# Patient Record
Sex: Female | Born: 1965 | Race: White | Hispanic: No | Smoking: Former smoker
Health system: Southern US, Community
[De-identification: ages and names within clinical notes are randomized; demographics above are authoritative.]

## PROBLEM LIST (undated history)

## (undated) DIAGNOSIS — F329 Major depressive disorder, single episode, unspecified: Secondary | ICD-10-CM

## (undated) DIAGNOSIS — F32A Depression, unspecified: Secondary | ICD-10-CM

## (undated) DIAGNOSIS — R232 Flushing: Secondary | ICD-10-CM

## (undated) DIAGNOSIS — R079 Chest pain, unspecified: Secondary | ICD-10-CM

## (undated) DIAGNOSIS — K219 Gastro-esophageal reflux disease without esophagitis: Secondary | ICD-10-CM

## (undated) DIAGNOSIS — F988 Other specified behavioral and emotional disorders with onset usually occurring in childhood and adolescence: Secondary | ICD-10-CM

## (undated) DIAGNOSIS — R030 Elevated blood-pressure reading, without diagnosis of hypertension: Secondary | ICD-10-CM

## (undated) DIAGNOSIS — E119 Type 2 diabetes mellitus without complications: Secondary | ICD-10-CM

## (undated) DIAGNOSIS — F419 Anxiety disorder, unspecified: Secondary | ICD-10-CM

## (undated) DIAGNOSIS — E559 Vitamin D deficiency, unspecified: Secondary | ICD-10-CM

## (undated) DIAGNOSIS — R51 Headache: Secondary | ICD-10-CM

## (undated) DIAGNOSIS — R519 Headache, unspecified: Secondary | ICD-10-CM

## (undated) DIAGNOSIS — T7840XA Allergy, unspecified, initial encounter: Secondary | ICD-10-CM

## (undated) HISTORY — DX: Vitamin D deficiency, unspecified: E55.9

## (undated) HISTORY — DX: Elevated blood-pressure reading, without diagnosis of hypertension: R03.0

## (undated) HISTORY — DX: Headache, unspecified: R51.9

## (undated) HISTORY — DX: Other specified behavioral and emotional disorders with onset usually occurring in childhood and adolescence: F98.8

## (undated) HISTORY — DX: Chest pain, unspecified: R07.9

## (undated) HISTORY — PX: ABDOMINAL HYSTERECTOMY: SHX81

## (undated) HISTORY — DX: Depression, unspecified: F32.A

## (undated) HISTORY — DX: Allergy, unspecified, initial encounter: T78.40XA

## (undated) HISTORY — DX: Gastro-esophageal reflux disease without esophagitis: K21.9

## (undated) HISTORY — DX: Headache: R51

## (undated) HISTORY — DX: Type 2 diabetes mellitus without complications: E11.9

## (undated) HISTORY — DX: Flushing: R23.2

## (undated) HISTORY — DX: Anxiety disorder, unspecified: F41.9

## (undated) HISTORY — DX: Major depressive disorder, single episode, unspecified: F32.9

---

## 1995-04-12 HISTORY — PX: OTHER SURGICAL HISTORY: SHX169

## 1995-04-12 HISTORY — PX: TRACHEOSTOMY: SUR1362

## 1998-03-10 ENCOUNTER — Emergency Department (HOSPITAL_COMMUNITY): Admission: EM | Admit: 1998-03-10 | Discharge: 1998-03-11 | Payer: Self-pay | Admitting: Emergency Medicine

## 2016-01-06 ENCOUNTER — Other Ambulatory Visit: Payer: Self-pay | Admitting: Family Medicine

## 2016-01-06 DIAGNOSIS — Z1231 Encounter for screening mammogram for malignant neoplasm of breast: Secondary | ICD-10-CM

## 2016-01-20 ENCOUNTER — Other Ambulatory Visit: Payer: Self-pay | Admitting: Family Medicine

## 2016-01-20 ENCOUNTER — Ambulatory Visit
Admission: RE | Admit: 2016-01-20 | Discharge: 2016-01-20 | Disposition: A | Discharge status: Home / Self Care | Payer: 59 | Source: Ambulatory Visit | Attending: Family Medicine | Admitting: Family Medicine

## 2016-01-20 DIAGNOSIS — Z1231 Encounter for screening mammogram for malignant neoplasm of breast: Secondary | ICD-10-CM

## 2016-02-17 ENCOUNTER — Telehealth: Payer: Self-pay | Admitting: *Deleted

## 2016-02-17 NOTE — Telephone Encounter (Signed)
Unable to reach patient at time of Pre-Visit Call.  Left message for patient to return call when available.    

## 2016-02-18 ENCOUNTER — Ambulatory Visit (HOSPITAL_BASED_OUTPATIENT_CLINIC_OR_DEPARTMENT_OTHER)
Admission: RE | Admit: 2016-02-18 | Discharge: 2016-02-18 | Disposition: A | Discharge status: Home / Self Care | Payer: 59 | Source: Ambulatory Visit | Attending: Family Medicine | Admitting: Family Medicine

## 2016-02-18 ENCOUNTER — Ambulatory Visit (INDEPENDENT_AMBULATORY_CARE_PROVIDER_SITE_OTHER): Payer: 59 | Admitting: Family Medicine

## 2016-02-18 ENCOUNTER — Encounter: Payer: Self-pay | Admitting: Family Medicine

## 2016-02-18 VITALS — BP 129/86 | HR 86 | Temp 98.1°F | Ht 62.0 in | Wt 211.4 lb

## 2016-02-18 DIAGNOSIS — K449 Diaphragmatic hernia without obstruction or gangrene: Secondary | ICD-10-CM | POA: Diagnosis not present

## 2016-02-18 DIAGNOSIS — R1011 Right upper quadrant pain: Secondary | ICD-10-CM | POA: Diagnosis not present

## 2016-02-18 DIAGNOSIS — S27809A Unspecified injury of diaphragm, initial encounter: Secondary | ICD-10-CM

## 2016-02-18 DIAGNOSIS — Z131 Encounter for screening for diabetes mellitus: Secondary | ICD-10-CM | POA: Diagnosis not present

## 2016-02-18 DIAGNOSIS — R938 Abnormal findings on diagnostic imaging of other specified body structures: Secondary | ICD-10-CM | POA: Insufficient documentation

## 2016-02-18 NOTE — Patient Instructions (Signed)
It was good to meet you today- please go to the lab and then to the imaging dept on the ground floor, I will be in touch with your labs and x-ray asap We will also set you up for an ultrasound of your gallbladder If you have any worsening of your symptoms- fever, vomiting, worsening pain- please seek care right away
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
## 2016-02-18 NOTE — Progress Notes (Signed)
Pre visit review using our clinic review tool, if applicable. No additional management support is needed unless otherwise documented below in the visit note. 

## 2016-02-19 ENCOUNTER — Ambulatory Visit (HOSPITAL_BASED_OUTPATIENT_CLINIC_OR_DEPARTMENT_OTHER)
Admission: RE | Admit: 2016-02-19 | Discharge: 2016-02-19 | Disposition: A | Discharge status: Home / Self Care | Payer: 59 | Source: Ambulatory Visit | Attending: Family Medicine | Admitting: Family Medicine

## 2016-02-19 DIAGNOSIS — R932 Abnormal findings on diagnostic imaging of liver and biliary tract: Secondary | ICD-10-CM | POA: Diagnosis not present

## 2016-02-19 DIAGNOSIS — R1011 Right upper quadrant pain: Secondary | ICD-10-CM | POA: Insufficient documentation

## 2016-02-19 LAB — COMPREHENSIVE METABOLIC PANEL
ALBUMIN: 4.5 g/dL (ref 3.5–5.2)
ALK PHOS: 65 U/L (ref 39–117)
ALT: 49 U/L — AB (ref 0–35)
AST: 31 U/L (ref 0–37)
BILIRUBIN TOTAL: 0.3 mg/dL (ref 0.2–1.2)
BUN: 12 mg/dL (ref 6–23)
CALCIUM: 9.8 mg/dL (ref 8.4–10.5)
CO2: 30 mEq/L (ref 19–32)
CREATININE: 0.8 mg/dL (ref 0.40–1.20)
Chloride: 103 mEq/L (ref 96–112)
GFR: 80.13 mL/min (ref >60.00)
Glucose, Bld: 81 mg/dL (ref 70–99)
Potassium: 4.6 mEq/L (ref 3.5–5.1)
SODIUM: 141 meq/L (ref 135–145)
TOTAL PROTEIN: 7.7 g/dL (ref 6.0–8.3)

## 2016-02-19 LAB — CBC
HCT: 42 % (ref 36.0–46.0)
Hemoglobin: 14 g/dL (ref 12.0–15.0)
MCHC: 33.3 g/dL (ref 30.0–36.0)
MCV: 89 fl (ref 78.0–100.0)
PLATELETS: 238 10*3/uL (ref 150.0–400.0)
RBC: 4.71 Mil/uL (ref 3.87–5.11)
RDW: 13.7 % (ref 11.5–15.5)
WBC: 8.8 10*3/uL (ref 4.0–10.5)

## 2016-02-19 LAB — HEMOGLOBIN A1C: HEMOGLOBIN A1C: 5.9 % (ref 4.6–6.5)

## 2016-02-20 ENCOUNTER — Telehealth: Payer: Self-pay | Admitting: Family Medicine

## 2016-02-20 DIAGNOSIS — Z1211 Encounter for screening for malignant neoplasm of colon: Secondary | ICD-10-CM

## 2016-02-20 NOTE — Telephone Encounter (Signed)
Called her- she needs a referral to GI for a colonoscopy.  Will also see if I can get the reading room to dig up he old films from the past for comparison

## 2016-02-22 ENCOUNTER — Encounter: Payer: Self-pay | Admitting: Gastroenterology

## 2016-02-23 ENCOUNTER — Encounter: Payer: Self-pay | Admitting: Family Medicine

## 2016-02-23 NOTE — Telephone Encounter (Signed)
Called reading room- they are going to try and find her old films.  They do not seem to be digitized

## 2016-02-26 ENCOUNTER — Encounter: Payer: Self-pay | Admitting: Family Medicine

## 2016-03-28 ENCOUNTER — Telehealth: Payer: Self-pay | Admitting: *Deleted

## 2016-03-28 NOTE — Telephone Encounter (Signed)
Phoned pt and explained Dr. Loletha Carrow recommends OV instead of PV due to pt complaints during CPE. Scheduled as requested for Wednesday. Colonoscopy appointment not cancelled at this time.

## 2016-03-28 NOTE — Telephone Encounter (Signed)
I reviewed the chart.  I agree, she is having chronic RUQ pain and Dr Arlyn Dunning intention was clearly to get a GI consult about that ( in addition to needing a screening colonoscopy).  I need to see her in the office, please.  Clinic time is tight, especially with the holidays and I am working the hospital next week.  I have time this Wednesday afternoon.  Please ask if she can come then.

## 2016-03-28 NOTE — Telephone Encounter (Signed)
Dr. Loletha Carrow,   It appears that this patient was seen for a CPE in 11/17 with Dr. Edilia Bo.  Has some complaints of abdominal pain, and was referred for a screening colonoscopy.  Could you please review the chart?  Does this patient need an office visit or is it okay to proceed with the colonoscopy?  Thanks, Vinnie Level

## 2016-03-30 ENCOUNTER — Encounter: Payer: Self-pay | Admitting: Gastroenterology

## 2016-03-30 ENCOUNTER — Ambulatory Visit (INDEPENDENT_AMBULATORY_CARE_PROVIDER_SITE_OTHER): Payer: 59 | Admitting: Gastroenterology

## 2016-03-30 VITALS — BP 128/90 | HR 76 | Ht 62.0 in | Wt 213.0 lb

## 2016-03-30 DIAGNOSIS — K219 Gastro-esophageal reflux disease without esophagitis: Secondary | ICD-10-CM

## 2016-03-30 DIAGNOSIS — Z1211 Encounter for screening for malignant neoplasm of colon: Secondary | ICD-10-CM | POA: Diagnosis not present

## 2016-03-30 DIAGNOSIS — R0789 Other chest pain: Secondary | ICD-10-CM | POA: Diagnosis not present

## 2016-03-30 MED ORDER — NA SULFATE-K SULFATE-MG SULF 17.5-3.13-1.6 GM/177ML PO SOLN: 1.0000 | Freq: Once | ORAL | 0 refills | Status: AC

## 2016-03-30 NOTE — Patient Instructions (Addendum)
For the next 4-6 weeks, stop ranitidine and instead take omeprazole 20 mg twice daily (30 minutes before breakfast and evening meal).  This medicine is available over the counter.  You have been scheduled for a colonoscopy. Please follow written instructions given to you at your visit today.  Please pick up your prep supplies at the pharmacy within the next 1-3 days. If you use inhalers (even only as needed), please bring them with you on the day of your procedure. Your physician has requested that you go to www.startemmi.com and enter the access code given to you at your visit today. This web site gives a general overview about your procedure. However, you should still follow specific instructions given to you by our office regarding your preparation for the procedure.   Food Choices for Gastroesophageal Reflux Disease, Adult When you have gastroesophageal reflux disease (GERD), the foods you eat and your eating habits are very important. Choosing the right foods can help ease your discomfort. What guidelines do I need to follow?  Choose fruits, vegetables, whole grains, and low-fat dairy products.  Choose low-fat meat, fish, and poultry.  Limit fats such as oils, salad dressings, butter, nuts, and avocado.  Keep a food diary. This helps you identify foods that cause symptoms.  Avoid foods that cause symptoms. These may be different for everyone.  Eat small meals often instead of 3 large meals a day.  Eat your meals slowly, in a place where you are relaxed.  Limit fried foods.  Cook foods using methods other than frying.  Avoid drinking alcohol.  Avoid drinking large amounts of liquids with your meals.  Avoid bending over or lying down until 2-3 hours after eating. What foods are not recommended? These are some foods and drinks that may make your symptoms worse: Vegetables  Tomatoes. Tomato juice. Tomato and spaghetti sauce. Chili peppers. Onion and garlic. Horseradish. Fruits   Oranges, grapefruit, and lemon (fruit and juice). Meats  High-fat meats, fish, and poultry. This includes hot dogs, ribs, ham, sausage, salami, and bacon. Dairy  Whole milk and chocolate milk. Sour cream. Cream. Butter. Ice cream. Cream cheese. Drinks  Coffee and tea. Bubbly (carbonated) drinks or energy drinks. Condiments  Hot sauce. Barbecue sauce. Sweets/Desserts  Chocolate and cocoa. Donuts. Peppermint and spearmint. Fats and Oils  High-fat foods. This includes [place] fries and potato chips. Other  Vinegar. Strong spices. This includes black pepper, white pepper, red pepper, cayenne, curry powder, cloves, ginger, and chili powder. The items listed above may not be a complete list of foods and drinks to avoid. Contact your dietitian for more information.  This information is not intended to replace advice given to you by your health care provider. Make sure you discuss any questions you have with your health care provider. Document Released: 09/27/2011 Document Revised: 09/03/2015 Document Reviewed: 01/30/2013 Elsevier Interactive Patient Education  2017 [employer].  Gastroesophageal Reflux Scan A gastroesophageal reflux scan is a procedure that is used to check for gastroesophageal reflux, which is the backward flow of stomach contents into the tube that carries food from the mouth to the stomach (esophagus). The scan can also show if any stomach contents are inhaled (aspirated) into your lungs. You may need this scan if you have symptoms such as heartburn, vomiting, swallowing problems, or regurgitation. Regurgitation means that swallowed food is returning from the stomach to the esophagus. For this scan, you will drink a liquid that contains a small amount of a radioactive substance (tracer). A scanner  with a camera that detects the radioactive tracer is used to see if any of the material backs up into your esophagus. Tell a health care provider about:  Any allergies you  have.  All medicines you are taking, including vitamins, herbs, eye drops, creams, and over-the-counter medicines.  Any blood disorders you have.  Any surgeries you have had.  Any medical conditions you have.  If you are pregnant or you think that you may be pregnant.  If you are breastfeeding. What are the risks? Generally, this is a safe procedure. However, problems may occur, including:  Exposure to radiation (a small amount).  Allergic reaction to the radioactive substance. This is rare. What happens before the procedure?  Ask your health care provider about changing or stopping your regular medicines. This is especially important if you are taking diabetes medicines or blood thinners.  Follow your health care provider's instructions about eating or drinking restrictions. What happens during the procedure?  You will be asked to drink a liquid that contains a small amount of a radioactive tracer. This liquid will probably be similar to orange juice.  You will assume a position lying on your back.  A series of images will be taken of your esophagus and upper stomach.  You may be asked to move into different positions to help determine if reflux occurs more often when you are in specific positions.  For adults, an abdominal binder with an inflatable cuff may be placed on the belly (abdomen). This may be used to increase abdominal pressure. More images will be taken to see if the increased pressure causes reflux to occur. The procedure may vary among health care providers and hospitals. What happens after the procedure?  Return to your normal activities and your normal diet as directed by your health care provider.  The radioactive tracer will leave your body over the next few days. Drink enough fluid to keep your urine clear or pale yellow. This will help to flush the tracer out of your body.  It is your responsibility to obtain your test results. Ask your health care  provider or the department performing the test when and how you will get your results. This information is not intended to replace advice given to you by your health care provider. Make sure you discuss any questions you have with your health care provider. Document Released: 05/19/2005 Document Revised: 12/21/2015 Document Reviewed: 01/07/2014 Elsevier Interactive Patient Education  2017 [employer].   If you are age 17 or older, your body mass index should be between 23-30. Your Body mass index is 38.96 kg/m. If this is out of the aforementioned range listed, please consider follow up with your Primary Care Provider.  If you are age 60 or younger, your body mass index should be between 19-25. Your Body mass index is 38.96 kg/m. If this is out of the aformentioned range listed, please consider follow up with your Primary Care Provider.   Thank you for choosing [facility]  Dr Wilfrid Lund III
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
## 2016-04-13 ENCOUNTER — Telehealth: Payer: Self-pay | Admitting: Gastroenterology

## 2016-04-14 NOTE — Telephone Encounter (Signed)
Not this time 

## 2016-04-15 ENCOUNTER — Encounter: Payer: 59 | Admitting: Gastroenterology

## 2016-06-10 ENCOUNTER — Encounter (HOSPITAL_COMMUNITY): Payer: Self-pay

## 2016-06-10 ENCOUNTER — Emergency Department (HOSPITAL_COMMUNITY)
Admission: EM | Admit: 2016-06-10 | Discharge: 2016-06-11 | Disposition: A | Discharge status: Home / Self Care | Payer: 59 | Attending: Emergency Medicine | Admitting: Emergency Medicine

## 2016-06-10 DIAGNOSIS — R1031 Right lower quadrant pain: Secondary | ICD-10-CM | POA: Diagnosis present

## 2016-06-10 DIAGNOSIS — R1900 Intra-abdominal and pelvic swelling, mass and lump, unspecified site: Secondary | ICD-10-CM | POA: Insufficient documentation

## 2016-06-10 DIAGNOSIS — K659 Peritonitis, unspecified: Secondary | ICD-10-CM | POA: Diagnosis not present

## 2016-06-10 DIAGNOSIS — K529 Noninfective gastroenteritis and colitis, unspecified: Secondary | ICD-10-CM

## 2016-06-10 DIAGNOSIS — K6389 Other specified diseases of intestine: Secondary | ICD-10-CM

## 2016-06-10 DIAGNOSIS — F17210 Nicotine dependence, cigarettes, uncomplicated: Secondary | ICD-10-CM | POA: Insufficient documentation

## 2016-06-10 LAB — COMPREHENSIVE METABOLIC PANEL
ALT: 30 U/L (ref 14–54)
AST: 25 U/L (ref 15–41)
Albumin: 4.1 g/dL (ref 3.5–5.0)
Alkaline Phosphatase: 58 U/L (ref 38–126)
Anion gap: 9 (ref 5–15)
BUN: 10 mg/dL (ref 6–20)
CHLORIDE: 102 mmol/L (ref 101–111)
CO2: 26 mmol/L (ref 22–32)
CREATININE: 0.84 mg/dL (ref 0.44–1.00)
Calcium: 9.1 mg/dL (ref 8.9–10.3)
GFR calc Af Amer: >60 mL/min (ref >60)
Glucose, Bld: 132 mg/dL — ABNORMAL HIGH (ref 65–99)
Potassium: 3.7 mmol/L (ref 3.5–5.1)
SODIUM: 137 mmol/L (ref 135–145)
Total Bilirubin: 0.6 mg/dL (ref 0.3–1.2)
Total Protein: 7.2 g/dL (ref 6.5–8.1)

## 2016-06-10 LAB — CBC
HCT: 41.9 % (ref 36.0–46.0)
Hemoglobin: 13.6 g/dL (ref 12.0–15.0)
MCH: 29.7 pg (ref 26.0–34.0)
MCHC: 32.5 g/dL (ref 30.0–36.0)
MCV: 91.5 fL (ref 78.0–100.0)
PLATELETS: 234 10*3/uL (ref 150–400)
RBC: 4.58 MIL/uL (ref 3.87–5.11)
RDW: 12.6 % (ref 11.5–15.5)
WBC: 9.6 10*3/uL (ref 4.0–10.5)

## 2016-06-10 LAB — URINALYSIS, ROUTINE W REFLEX MICROSCOPIC
Bilirubin Urine: NEGATIVE
Glucose, UA: NEGATIVE mg/dL
Hgb urine dipstick: NEGATIVE
KETONES UR: NEGATIVE mg/dL
Nitrite: NEGATIVE
PH: 7 (ref 5.0–8.0)
Protein, ur: NEGATIVE mg/dL
SPECIFIC GRAVITY, URINE: 1.019 (ref 1.005–1.030)

## 2016-06-10 LAB — LIPASE, BLOOD: LIPASE: 32 U/L (ref 11–51)

## 2016-06-10 MED ORDER — IBUPROFEN 400 MG PO TABS
ORAL_TABLET | ORAL | Status: DC
Start: 2016-06-10 — End: 2016-06-11
  Filled 2016-06-10: qty 1

## 2016-06-10 MED ORDER — IBUPROFEN 400 MG PO TABS
400.0000 mg | ORAL_TABLET | Freq: Once | ORAL | Status: AC | PRN
  Administered 2016-06-10: 400 mg via ORAL

## 2016-06-10 NOTE — ED Provider Notes (Signed)
[facility] DEPT Provider Note   CSN: [id number] Arrival date & time: 06/10/16  1802     History   Chief Complaint Chief Complaint  Patient presents with  . Abdominal Pain    HPI NAOKO COPPA is a 51 y.o. female with a past medical history of previous abdominal surgeries. He presents emergency Department with chief complaint of right lower quadrant. Pain. Patient states the onset of symptoms began yesterday p.m. She complains of right lower quadrant pain that is constant but at times sharp and severe. She rates the pain between 5 and 7 out of 10. She denies any urinary symptoms, vaginal symptoms, and history of kidney stones, nausea, vomiting, diarrhea. She denies fevers or chills. Patient is postmenopausal  HPI  Past Medical History:  Diagnosis Date  . Depression   . Elevated blood pressure reading   . Frequent headaches     There are no active problems to display for this patient.   Past Surgical History:  Procedure Laterality Date  . Stomach ulcer    . TRACHEOSTOMY      OB History    No data available       Home Medications    Prior to Admission medications   Medication Sig Start Date End Date Taking? Authorizing Provider  ranitidine (ZANTAC) 150 MG capsule Take 150 mg by mouth 2 (two) times daily.    Historical Provider, MD    Family History Family History  Problem Relation Age of Onset  . Diabetes Sister   . Ovarian cancer Sister   . Emphysema Paternal Grandmother     Social History Social History  Substance Use Topics  . Smoking status: Current Every Day Smoker    Types: E-cigarettes  . Smokeless tobacco: Former Systems developer    Quit date: 04/12/2011     Comment: Quit smoking Cigarettes around 2013. Currently using vapes  . Alcohol use Yes     Comment: occ      Allergies   Penicillins   Review of Systems Review of Systems Ten systems reviewed and are negative for acute change, except as noted in the HPI.    Physical Exam Updated Vital  Signs BP 134/79 (BP Location: Right Arm)   Pulse 73   Temp 98.6 F (37 C) (Oral)   Resp 18   Ht 5' 1.5" (1.562 m)   SpO2 95%   Physical Exam  Physical Exam  Nursing note and vitals reviewed. Constitutional: She is oriented to person, place, and time. She appears well-developed and well-nourished. No distress.  HENT:  Head: Normocephalic and atraumatic.  Eyes: Conjunctivae normal and EOM are normal. Pupils are equal, round, and reactive to light. No scleral icterus.  Neck: Normal range of motion.  Cardiovascular: Normal rate, regular rhythm and normal heart sounds.  Exam reveals no gallop and no friction rub.   No murmur heard. Pulmonary/Chest: Effort normal and breath sounds normal. No respiratory distress.  Abdominal: Soft. Bowel sounds are normal. She exhibits no distention. Mild diffuse tenderness, worse in the right lower quadrant with negative obturator and Rovsing sign distension Neurological: She is alert and oriented to person, place, and time.  Skin: Skin is warm and dry. She is not diaphoretic.    ED Treatments / Results  Labs (all labs ordered are listed, but only abnormal results are displayed) Labs Reviewed  COMPREHENSIVE METABOLIC PANEL - Abnormal; Notable for the following:       Result Value   Glucose, Bld 132 (*)    All  other components within normal limits  URINALYSIS, ROUTINE W REFLEX MICROSCOPIC - Abnormal; Notable for the following:    APPearance HAZY (*)    Leukocytes, UA MODERATE (*)    Bacteria, UA MANY (*)    Squamous Epithelial / LPF 6-30 (*)    All other components within normal limits  LIPASE, BLOOD  CBC    EKG  EKG Interpretation None       Radiology No results found.  Procedures Procedures (including critical care time)  Medications Ordered in ED Medications  ibuprofen (ADVIL,MOTRIN) 400 MG tablet (not administered)  ibuprofen (ADVIL,MOTRIN) tablet 400 mg (400 mg Oral Given 06/10/16 1832)     Initial Impression / Assessment  and Plan / ED Course  I have reviewed the triage vital signs and the nursing notes.  Pertinent labs & imaging results that were available during my care of the patient were reviewed by me and considered in my medical decision making (see chart for details).     Patient with right-sided epiploic appendage iritis. There is also an incidental pelvic mass noted on the CT scan. This is on the left side with the patient does not have pain. I believe the patient's urine appears contaminated. Patient will be discharged with pain control. She is advised to follow-up with her primary care physician regarding her pelvic mass for further evaluation. I discussed return precautions with the patient. She appears safe for discharge at this time  Final Clinical Impressions(s) / ED Diagnoses   Final diagnoses:  Epiploic appendagitis  Pelvic mass in female    New Prescriptions New Prescriptions   No medications on file     Margarita Mail, PA-C 06/11/16 [place], MD 06/12/16 2328

## 2016-06-10 NOTE — ED Triage Notes (Signed)
Per Pt, Pt is coming from home with complaints of lower right abdominal pain that starts yesterday. Denies any N/V/D. Denies urinary symptoms or vaginal discharge. Reports going through menopause.

## 2016-06-11 ENCOUNTER — Encounter (HOSPITAL_COMMUNITY): Payer: Self-pay | Admitting: Radiology

## 2016-06-11 ENCOUNTER — Emergency Department (HOSPITAL_COMMUNITY): Payer: 59

## 2016-06-11 MED ORDER — IOPAMIDOL (ISOVUE-300) INJECTION 61%
INTRAVENOUS | Status: AC
Start: 2016-06-11 — End: 2016-06-11
  Administered 2016-06-11: 100 mL
  Filled 2016-06-11: qty 100

## 2016-06-11 MED ORDER — MELOXICAM 15 MG PO TABS: 15.0000 mg | ORAL_TABLET | Freq: Every day | ORAL | 0 refills | Status: DC

## 2016-06-11 MED ORDER — OXYCODONE-ACETAMINOPHEN 5-325 MG PO TABS: 1.0000 | ORAL_TABLET | ORAL | 0 refills | Status: DC | PRN

## 2016-06-11 NOTE — ED Notes (Signed)
Patient transported to CT 

## 2016-06-11 NOTE — Discharge Instructions (Signed)
Epiploic appendagitis is a condition when one for the fat containing finger-like protrusions that hang from the colon twists on itself and begins to die . It is uncommon but benign and self limited. It frequently mimics appendicitis.  Your CT scan also showed a pelvic mass. You will need to follow up with your primary care doctor for further evaluation and you will need a pelvic ultrasound to characterize the mass.  SEEK IMMEDIATE MEDICAL ATTENTION IF: The pain does not go away or becomes severe.  A temperature above 101 develops.  Repeated vomiting occurs (multiple episodes).  The pain becomes localized to portions of the abdomen. The right side could possibly be appendicitis. In an adult, the left lower portion of the abdomen could be colitis or diverticulitis.  Blood is being passed in stools or vomit (bright red or black tarry stools).  Return also if you develop chest pain, difficulty breathing, dizziness or fainting, or become confused, poorly responsive, or inconsolable (young children).

## 2016-06-13 ENCOUNTER — Ambulatory Visit: Payer: Self-pay | Admitting: Family Medicine

## 2016-06-16 ENCOUNTER — Ambulatory Visit (HOSPITAL_BASED_OUTPATIENT_CLINIC_OR_DEPARTMENT_OTHER)
Admission: RE | Admit: 2016-06-16 | Discharge: 2016-06-16 | Disposition: A | Discharge status: Home / Self Care | Payer: 59 | Source: Ambulatory Visit | Attending: Family Medicine | Admitting: Family Medicine

## 2016-06-16 ENCOUNTER — Ambulatory Visit (INDEPENDENT_AMBULATORY_CARE_PROVIDER_SITE_OTHER): Payer: 59 | Admitting: Family Medicine

## 2016-06-16 VITALS — BP 122/86 | HR 82 | Temp 97.9°F | Ht 62.0 in | Wt 212.4 lb

## 2016-06-16 DIAGNOSIS — R1900 Intra-abdominal and pelvic swelling, mass and lump, unspecified site: Secondary | ICD-10-CM | POA: Insufficient documentation

## 2016-06-16 MED ORDER — OXYCODONE-ACETAMINOPHEN 5-325 MG PO TABS: 1.0000 | ORAL_TABLET | Freq: Four times a day (QID) | ORAL | 0 refills | Status: DC | PRN

## 2016-06-16 NOTE — Patient Instructions (Signed)
Go to the imaging dept on the ground floor right away- they will get your ultrasound. We should get your results today!

## 2016-06-16 NOTE — Progress Notes (Signed)
[place] at [facility] [address] [place], [place] [address] [phone]  Date:  06/16/2016   Name:  Betty ALBERTS   DOB:  1965/06/25   MRN:  [id number]  PCP:  Lamar Blinks, MD    Chief Complaint: Follow-up (Pt here for CT scan f/u. Still having some lower right abd pain. )   History of Present Illness:  Betty Snyder is a 51 y.o. very pleasant female patient who presents with the following:  Seen in ED on 06-10-16.  HPI from this visit:  Betty Snyder is a 51 y.o. female with a past medical history of previous abdominal surgeries. He presents emergency Department with chief complaint of right lower quadrant. Pain. Patient states the onset of symptoms began yesterday p.m. She complains of right lower quadrant pain that is constant but at times sharp and severe. She rates the pain between 5 and 7 out of 10. She denies any urinary symptoms, vaginal symptoms, and history of kidney stones, nausea, vomiting, diarrhea. She denies fevers or chills. Patient is postmenopausal  Plan from ED visit on 06-10-16:  Patient with right-sided epiploic appendage iritis. There is also an incidental pelvic mass noted on the CT scan. This is on the left side with the patient does not have pain. I believe the patient's urine appears contaminated. Patient will be discharged with pain control. She is advised to follow-up with her primary care physician regarding her pelvic mass for further evaluation. I discussed return precautions with the patient. She appears safe for discharge at this time   HPI for today's visit:  Here today to follow-up on left sided pelvic mass that was discovered when the patient had a CT scan in the ED on 06-10-16.  Her other symptoms are improved but not totally resolved as of yet She has been very worried about her CT results and is afraid that she may have ovarian cancer.  She is eager to get her follow-up imaging asap    Related impression from CT:   1. Inflamed fat lobule on the right pericolic gutter, may be fat necrosis versus epiploic appendagitis. This is likely cause of patient's right-sided abdominal pain. The appendix is normal. 2. Lobular pelvic soft tissue mass. Differential considerations include exophytic uterine fibroid versus right ovarian mass. No prior exams for comparison. Recommend pelvic ultrasound for characterization. 3. Left lumbar hernia contains nonobstructed noninflamed descending colon. 4. Hepatic steatosis. 5. Abdominal aortic atherosclerosis.  Pt feels like she is overall getting better, her pain is improved She is able to eat again, and is having normal bowel/ bladder function She is still using the oxycodone on occasion but has a few pills left.  She would like to have a refill to use if needed She was given 10 oxycodone by the ER No fever or chills  There are no active problems to display for this patient.   Past Medical History:  Diagnosis Date  . Depression   . Elevated blood pressure reading   . Frequent headaches     Past Surgical History:  Procedure Laterality Date  . Stomach ulcer    . TRACHEOSTOMY      Social History  Substance Use Topics  . Smoking status: Current Every Day Smoker    Types: E-cigarettes  . Smokeless tobacco: Former Systems developer    Quit date: 04/12/2011     Comment: Quit smoking Cigarettes around 2013. Currently using vapes  . Alcohol use Yes  Comment: occ     Family History  Problem Relation Age of Onset  . Diabetes Sister   . Ovarian cancer Sister   . Emphysema Paternal Grandmother     Allergies  Allergen Reactions  . Penicillins Other (See Comments)    Pt states that she "blacks out".    Medication list has been reviewed and updated.  Current Outpatient Prescriptions on File Prior to Visit  Medication Sig Dispense Refill  . meloxicam (MOBIC) 15 MG tablet Take 1 tablet (15 mg total) by mouth daily. Take 1 daily with food. 10  tablet 0  . ranitidine (ZANTAC) 150 MG tablet Take 150 mg by mouth 2 (two) times daily as needed for heartburn.     No current facility-administered medications on file prior to visit.     Review of Systems:  As per HPI- otherwise negative.   Physical Examination: Vitals:   06/16/16 1603  BP: 122/86  Pulse: 82  Temp: 97.9 F (36.6 C)   Vitals:   06/16/16 1603  Weight: 212 lb 6.4 oz (96.3 kg)  Height: 5\' 2"  (1.575 m)   Body mass index is 38.85 kg/m. Ideal Body Weight: Weight in (lb) to have BMI = 25: 136.4  GEN: WDWN, NAD, Non-toxic, A & O x 3, obese, ow looks well HEENT: Atraumatic, Normocephalic. Neck supple. No masses, No LAD. Ears and Nose: No external deformity. CV: RRR, No M/G/R. No JVD. No thrill. No extra heart sounds. PULM: CTA B, no wheezes, crackles, rhonchi. No retractions. No resp. distress. No accessory muscle use. ABD: S,  ND, +BS. No rebound. No HSM.  Minimal RLQ tenderness to palpation No masses EXTR: No c/c/e NEURO Normal gait.  PSYCH: Normally interactive. Conversant. Not depressed or anxious appearing.  Calm demeanor.   Plan to get her [name] today Assessment and Plan:  Pelvic mass in female - Plan: US Pelvis Complete, US Transvaginal Non-OB  Following up today from recent ER visit when she had a CT abd pelvis to r/o appendicitis.  Appendix was normal but she was noted to have a right sided pelvic mass of some sort- ? Ovarian. She is here today to follow-up on this issue  Reviewed NCCSR- the 10 percocet she got from the ER are listed, nothing else  Meds ordered this encounter  Medications  . oxyCODONE-acetaminophen (PERCOCET) 5-325 MG tablet    Sig: Take 1-2 tablets by mouth every 6 (six) hours as needed.    Dispense:  10 tablet    Refill:  0      Signed Lamar Blinks, MD  US Transvaginal Non-ob  Result Date: 06/16/2016 CLINICAL DATA:  Pelvic mass. EXAM: TRANSABDOMINAL AND TRANSVAGINAL ULTRASOUND OF PELVIS TECHNIQUE: Both transabdominal  and transvaginal ultrasound examinations of the pelvis were performed. Transabdominal technique was performed for global imaging of the pelvis including uterus, ovaries, adnexal regions, and pelvic cul-de-sac. It was necessary to proceed with endovaginal exam following the transabdominal exam to visualize the uterus and ovaries. COMPARISON:  06/11/2016 CT abdomen and pelvis FINDINGS: Uterus Measurements: 8.4 x 4.1 x 4.0 cm. Pelvic mass measuring 6.5 x 6.3 x 4.4 cm appears contiguous with the fundus of the uterus compatible with exophytic subserosal myoma. The mass extends anterior and to the right of the uterus. Endometrium Thickness: 10.6 mm.  No focal abnormality visualized. Right ovary Measurements: 1.4 x 2.3 x 1.6 cm. Normal in appearance and distinct from the pelvic mass. Left ovary Not visualized. Other findings No abnormal free fluid. IMPRESSION: Pelvic mass on CT  appears contiguous with the fundus of the uterus compatible with exophytic subserosal myoma. The right ovary is identified distinct from the mass. Left ovary not visualized. Electronically Signed   By: Kristine Garbe M.D.   On: 06/16/2016 18:35   US Pelvis Complete  Result Date: 06/16/2016 CLINICAL DATA:  Pelvic mass. EXAM: TRANSABDOMINAL AND TRANSVAGINAL ULTRASOUND OF PELVIS TECHNIQUE: Both transabdominal and transvaginal ultrasound examinations of the pelvis were performed. Transabdominal technique was performed for global imaging of the pelvis including uterus, ovaries, adnexal regions, and pelvic cul-de-sac. It was necessary to proceed with endovaginal exam following the transabdominal exam to visualize the uterus and ovaries. COMPARISON:  06/11/2016 CT abdomen and pelvis FINDINGS: Uterus Measurements: 8.4 x 4.1 x 4.0 cm. Pelvic mass measuring 6.5 x 6.3 x 4.4 cm appears contiguous with the fundus of the uterus compatible with exophytic subserosal myoma. The mass extends anterior and to the right of the uterus. Endometrium Thickness:  10.6 mm.  No focal abnormality visualized. Right ovary Measurements: 1.4 x 2.3 x 1.6 cm. Normal in appearance and distinct from the pelvic mass. Left ovary Not visualized. Other findings No abnormal free fluid. IMPRESSION: Pelvic mass on CT appears contiguous with the fundus of the uterus compatible with exophytic subserosal myoma. The right ovary is identified distinct from the mass. Left ovary not visualized. Electronically Signed   By: Kristine Garbe M.D.   On: 06/16/2016 18:35   Ct Abdomen Pelvis W Contrast  Result Date: 06/11/2016 CLINICAL DATA:  Right lower quadrant pain. EXAM: CT ABDOMEN AND PELVIS WITH CONTRAST TECHNIQUE: Multidetector CT imaging of the abdomen and pelvis was performed using the standard protocol following bolus administration of intravenous contrast. CONTRAST:  141mL ISOVUE-300 IOPAMIDOL (ISOVUE-300) INJECTION 61% COMPARISON:  Right upper quadrant ultrasound 02/19/2016 FINDINGS: Lower chest: Elevation of the left hemidiaphragm with left basilar scarring. Emphysematous change at the lung bases. Trachea atelectasis in the right middle lobe. Hepatobiliary: Decreased hepatic density consistent with steatosis. No evidence of focal hepatic lesion. Gallbladder physiologically distended, no calcified stone. No biliary dilatation. Pancreas: No ductal dilatation or inflammation. Spleen: Normal in size without focal abnormality. Adrenals/Urinary Tract: No adrenal nodule. Symmetric renal enhancement and excretion on delayed phase imaging. Cortical scarring in the medial right and lateral left kidney. No hydronephrosis or perinephric edema. Urinary bladder is minimally distended. Stomach/Bowel: The appendix is normal. There is a fat lobule about the right pericolic gutter that may be an inflamed epiploic appendage or fat necrosis. No associated colonic inflammation. Left lumbar no small bowel dilatation or inflammation. Stomach is physiologically distended. Hernia contains normal appearing  descending colon, no obstruction or wall thickening. Vascular/Lymphatic: Mild aortic atherosclerosis without aneurysm. No adenopathy. Reproductive: Well-defined lobulated pelvic mass is just to the right of midline measures 6.6 x 6.6 x 7.0 cm and is may be contiguous with the uterine fundus. The left ovary is discretely visualized and is normal. The right ovary is not confidently identified. Other: No free air, free fluid, or intra-abdominal fluid collection. Small fat containing supraumbilical ventral abdominal wall hernia. Musculoskeletal: There are no acute or suspicious osseous abnormalities. IMPRESSION: 1. Inflamed fat lobule on the right pericolic gutter, may be fat necrosis versus epiploic appendagitis. This is likely cause of patient's right-sided abdominal pain. The appendix is normal. 2. Lobular pelvic soft tissue mass. Differential considerations include exophytic uterine fibroid versus right ovarian mass. No prior exams for comparison. Recommend pelvic ultrasound for characterization. 3. Left lumbar hernia contains nonobstructed noninflamed descending colon. 4. Hepatic steatosis. 5. Abdominal aortic atherosclerosis.  Electronically Signed   By: Jeb Levering M.D.   On: 06/11/2016 01:19   Called her- we are happy to report that the mass noted on CT is c/w a uterine fibroid.  She is quite relieved and will plan to see me son for a pap smear

## 2017-07-11 DIAGNOSIS — M79642 Pain in left hand: Secondary | ICD-10-CM | POA: Insufficient documentation

## 2017-07-11 DIAGNOSIS — M79641 Pain in right hand: Secondary | ICD-10-CM | POA: Insufficient documentation

## 2017-07-13 DIAGNOSIS — G5603 Carpal tunnel syndrome, bilateral upper limbs: Secondary | ICD-10-CM | POA: Insufficient documentation

## 2017-08-04 DIAGNOSIS — M7700 Medial epicondylitis, unspecified elbow: Secondary | ICD-10-CM | POA: Insufficient documentation

## 2017-10-11 ENCOUNTER — Telehealth: Payer: Self-pay

## 2017-10-11 NOTE — Telephone Encounter (Signed)
Yes

## 2017-10-11 NOTE — Telephone Encounter (Signed)
Copied from [place] [id number]. Topic: Inquiry >> Oct 11, 2017 11:27 AM Valla Leaver wrote: Reason for CRM: Patient would like to switch PCP from Copland at [facility] to one of the NP's at [facility]. Please call patient to notify if approved.

## 2017-10-11 NOTE — Telephone Encounter (Signed)
Please advise 

## 2017-10-11 NOTE — Telephone Encounter (Signed)
Ok with me 

## 2017-10-11 NOTE — Telephone Encounter (Signed)
Appointment 8/7 pt aware

## 2017-10-11 NOTE — Telephone Encounter (Signed)
Ok to schedule transfer appointment ?

## 2017-11-15 ENCOUNTER — Ambulatory Visit: Payer: 59 | Admitting: Family Medicine

## 2017-11-15 ENCOUNTER — Other Ambulatory Visit: Payer: Self-pay | Admitting: Family Medicine

## 2017-11-15 ENCOUNTER — Encounter: Payer: Self-pay | Admitting: Family Medicine

## 2017-11-15 VITALS — BP 106/70 | HR 67 | Temp 98.5°F | Ht 62.0 in | Wt 195.0 lb

## 2017-11-15 DIAGNOSIS — R739 Hyperglycemia, unspecified: Secondary | ICD-10-CM | POA: Diagnosis not present

## 2017-11-15 DIAGNOSIS — Z1321 Encounter for screening for nutritional disorder: Secondary | ICD-10-CM | POA: Diagnosis not present

## 2017-11-15 DIAGNOSIS — E669 Obesity, unspecified: Secondary | ICD-10-CM | POA: Diagnosis not present

## 2017-11-15 DIAGNOSIS — Z1231 Encounter for screening mammogram for malignant neoplasm of breast: Secondary | ICD-10-CM

## 2017-11-15 DIAGNOSIS — K068 Other specified disorders of gingiva and edentulous alveolar ridge: Secondary | ICD-10-CM | POA: Diagnosis not present

## 2017-11-15 DIAGNOSIS — Z7689 Persons encountering health services in other specified circumstances: Secondary | ICD-10-CM

## 2017-11-15 LAB — COMPREHENSIVE METABOLIC PANEL
ALT: 18 U/L (ref 0–35)
AST: 16 U/L (ref 0–37)
Albumin: 4.4 g/dL (ref 3.5–5.2)
Alkaline Phosphatase: 59 U/L (ref 39–117)
BILIRUBIN TOTAL: 0.4 mg/dL (ref 0.2–1.2)
BUN: 13 mg/dL (ref 6–23)
CO2: 31 mEq/L (ref 19–32)
CREATININE: 0.82 mg/dL (ref 0.40–1.20)
Calcium: 10 mg/dL (ref 8.4–10.5)
Chloride: 104 mEq/L (ref 96–112)
GFR: 77.36 mL/min (ref >60.00)
GLUCOSE: 96 mg/dL (ref 70–99)
Potassium: 4.1 mEq/L (ref 3.5–5.1)
SODIUM: 140 meq/L (ref 135–145)
Total Protein: 7.3 g/dL (ref 6.0–8.3)

## 2017-11-15 LAB — CBC WITH DIFFERENTIAL/PLATELET
BASOS ABS: 0 10*3/uL (ref 0.0–0.1)
Basophils Relative: 0.5 % (ref 0.0–3.0)
EOS ABS: 0.1 10*3/uL (ref 0.0–0.7)
Eosinophils Relative: 1.4 % (ref 0.0–5.0)
HEMATOCRIT: 43 % (ref 36.0–46.0)
Hemoglobin: 14.3 g/dL (ref 12.0–15.0)
LYMPHS ABS: 2.1 10*3/uL (ref 0.7–4.0)
LYMPHS PCT: 33.5 % (ref 12.0–46.0)
MCHC: 33.3 g/dL (ref 30.0–36.0)
MCV: 88.8 fl (ref 78.0–100.0)
Monocytes Absolute: 0.4 10*3/uL (ref 0.1–1.0)
Monocytes Relative: 5.8 % (ref 3.0–12.0)
NEUTROS ABS: 3.6 10*3/uL (ref 1.4–7.7)
NEUTROS PCT: 58.8 % (ref 43.0–77.0)
Platelets: 222 10*3/uL (ref 150.0–400.0)
RBC: 4.84 Mil/uL (ref 3.87–5.11)
RDW: 13.5 % (ref 11.5–15.5)
WBC: 6.2 10*3/uL (ref 4.0–10.5)

## 2017-11-15 LAB — HEMOGLOBIN A1C: Hgb A1c MFr Bld: 5.9 % (ref 4.6–6.5)

## 2017-11-15 LAB — VITAMIN D 25 HYDROXY (VIT D DEFICIENCY, FRACTURES): VITD: 18.94 ng/mL — AB (ref 30.00–100.00)

## 2017-11-15 LAB — TSH: TSH: 2.05 u[IU]/mL (ref 0.35–4.50)

## 2017-11-15 LAB — LIPID PANEL
CHOL/HDL RATIO: 5
Cholesterol: 187 mg/dL (ref 0–200)
HDL: 35.7 mg/dL — ABNORMAL LOW (ref >39.00)
LDL CALC: 115 mg/dL — AB (ref 0–99)
NonHDL: 150.96
Triglycerides: 181 mg/dL — ABNORMAL HIGH (ref 0.0–149.0)
VLDL: 36.2 mg/dL (ref 0.0–40.0)

## 2017-11-15 NOTE — Progress Notes (Signed)
   Subjective:    Patient ID: Betty Snyder, female    DOB: 1965-06-02, 52 y.o.   MRN: [id number]  HPI This is a 52 yo female who presents today to establish care. Previously saw Dr. Janett Billow Copland. Works as an Research scientist (physical sciences).   Recently seen at dentist and had excessive bleeding and was suggested she have an A1C(?).   Carpel Tunnel- sees Dr. Amedeo Plenty  Last CPE-  Mammo- 10/17 Pap- overdue Colonoscopy- never Tdap- unknown Eye- not regular Dental- regular Exercise- gym- treadmill Diet- has been working on reducing carbs  Past Medical History:  Diagnosis Date  . Depression   . Elevated blood pressure reading   . Frequent headaches    Past Surgical History:  Procedure Laterality Date  . Stomach ulcer    . TRACHEOSTOMY     Family History  Problem Relation Age of Onset  . Diabetes Sister   . Ovarian cancer Sister   . Emphysema Paternal Grandmother    Social History   Tobacco Use  . Smoking status: Current Every Day Smoker    Types: E-cigarettes  . Smokeless tobacco: Former Systems developer    Quit date: 04/12/2011  . Tobacco comment: Quit smoking Cigarettes around 2013. Currently using vapes  Substance Use Topics  . Alcohol use: Yes    Comment: occ   . Drug use: No      Review of Systems Per HPI    Objective:   Physical Exam Physical Exam  Vitals reviewed. Constitutional: Oriented to person, place, and time. Appears well-developed and well-nourished.  HENT:  Head: Normocephalic and atraumatic.  Eyes: Conjunctivae are normal.  Neck: Normal range of motion. Neck supple.  Cardiovascular: Normal rate.   Pulmonary/Chest: Effort normal.  Musculoskeletal: Normal gait.  Neurological: Alert and oriented to person, place, and time.  Skin: Skin is warm and dry.  Psychiatric: Normal mood and affect. Behavior is normal. Judgment and thought content normal.      BP 106/70 (BP Location: Right Arm, Patient Position: Sitting, Cuff Size: Large)   Pulse 67   Temp 98.5 F (36.9  C) (Oral)   Ht 5\' 2"  (1.575 m)   Wt 195 lb (88.5 kg)   SpO2 97%   BMI 35.67 kg/m  Wt Readings from Last 3 Encounters:  11/15/17 195 lb (88.5 kg)  06/16/16 212 lb 6.4 oz (96.3 kg)  03/30/16 213 lb (96.6 kg)       Assessment & Plan:  1. Encounter to establish care - reviewed previous PCP records/labs - follow up in 3-4 months for CPE/pap, update overdue health maintenance - provided information to schedule mammogram - encouraged her to have annual eye exam  2. Bleeding gums - CBC with Differential - Comprehensive metabolic panel  3. Encounter for vitamin deficiency screening - Vitamin D, 25-hydroxy  4. Obesity (BMI 35.0-39.9 without comorbidity) - Hemoglobin A1c - Lipid Panel - TSH  5. Elevated blood sugar - Comprehensive metabolic panel - Hemoglobin A1c - Lipid Panel - TSH   Clarene Reamer, FNP-BC  [facility] at [facility], [place]  11/15/2017 10:47 AM

## 2017-11-15 NOTE — Patient Instructions (Addendum)
Try thumb spica splint for your carpel tunnel  Over the counter anti-inflammatory medication- ibuprofen 200 mg- can take 2-3 tablets every 8-12 hours as needed, Alleve 1-2 tablets every 12 hours- as needed. Can also use ice/heat.   Supplements for inflammation Turmeric  Glucosamine Tart Cherry  Please call and schedule an appointment for screening mammogram. A referral is not needed.  [place]  Please schedule your complete physical for 3-4 months

## 2017-11-20 MED ORDER — VITAMIN D (ERGOCALCIFEROL) 1.25 MG (50000 UNIT) PO CAPS: 50000.0000 [IU] | ORAL_CAPSULE | ORAL | 1 refills | Status: DC

## 2017-11-20 NOTE — Addendum Note (Signed)
Addended by: Clarene Reamer B on: 11/20/2017 07:57 AM   Modules accepted: Orders

## 2018-01-16 ENCOUNTER — Ambulatory Visit
Admission: RE | Admit: 2018-01-16 | Discharge: 2018-01-16 | Disposition: A | Discharge status: Home / Self Care | Payer: 59 | Source: Ambulatory Visit | Attending: Family Medicine | Admitting: Family Medicine

## 2018-01-16 DIAGNOSIS — Z1231 Encounter for screening mammogram for malignant neoplasm of breast: Secondary | ICD-10-CM

## 2018-02-12 ENCOUNTER — Other Ambulatory Visit (HOSPITAL_COMMUNITY)
Admission: RE | Admit: 2018-02-12 | Discharge: 2018-02-12 | Disposition: A | Discharge status: Home / Self Care | Payer: 59 | Source: Ambulatory Visit | Attending: Family Medicine | Admitting: Family Medicine

## 2018-02-12 ENCOUNTER — Ambulatory Visit (INDEPENDENT_AMBULATORY_CARE_PROVIDER_SITE_OTHER): Payer: 59 | Admitting: Family Medicine

## 2018-02-12 ENCOUNTER — Encounter: Payer: Self-pay | Admitting: Family Medicine

## 2018-02-12 VITALS — BP 110/78 | HR 67 | Temp 97.8°F | Ht 62.0 in | Wt 195.1 lb

## 2018-02-12 DIAGNOSIS — Z124 Encounter for screening for malignant neoplasm of cervix: Secondary | ICD-10-CM | POA: Insufficient documentation

## 2018-02-12 DIAGNOSIS — Z789 Other specified health status: Secondary | ICD-10-CM

## 2018-02-12 DIAGNOSIS — Z23 Encounter for immunization: Secondary | ICD-10-CM

## 2018-02-12 DIAGNOSIS — Z0000 Encounter for general adult medical examination without abnormal findings: Secondary | ICD-10-CM | POA: Diagnosis not present

## 2018-02-12 DIAGNOSIS — E559 Vitamin D deficiency, unspecified: Secondary | ICD-10-CM | POA: Diagnosis not present

## 2018-02-12 DIAGNOSIS — Z720 Tobacco use: Secondary | ICD-10-CM | POA: Insufficient documentation

## 2018-02-12 DIAGNOSIS — Z1211 Encounter for screening for malignant neoplasm of colon: Secondary | ICD-10-CM | POA: Diagnosis not present

## 2018-02-12 DIAGNOSIS — E669 Obesity, unspecified: Secondary | ICD-10-CM | POA: Insufficient documentation

## 2018-02-12 MED ORDER — VITAMIN D (ERGOCALCIFEROL) 1.25 MG (50000 UNIT) PO CAPS: 50000.0000 [IU] | ORAL_CAPSULE | ORAL | 3 refills | Status: DC

## 2018-02-12 NOTE — Progress Notes (Signed)
Subjective:    Patient ID: Betty Snyder, female    DOB: Feb 10, 1966, 53 y.o.   MRN: [id number]  HPI This is a 52 yo female who presents today for CPE. Has been doing well.   Last CPE- several years ago Mammo- 01/16/18 Pap- several years ago, not sexually active Colonoscopy- never, was scheduled with Dr. Loletha Carrow but got sick and had to cancel Tdap- today Flu- today Eye- overdue, plans to make appointment soon Dental- regular Exercise- not lately, is planning on walking with a coworker Sleep- good  GERD- symptoms well controlled, not taking any medication, avoiding triggers  Obesity- holding steady at 195. Has appointment with Dr. Leafy Ro for after the first of the year  Carpel tunnel- managed by Dr. Amedeo Plenty, may need surgery in the future  Nicotine abuse- rarely vapeing now    Past Medical History:  Diagnosis Date  . Depression   . Elevated blood pressure reading   . Frequent headaches    Past Surgical History:  Procedure Laterality Date  . Stomach ulcer    . TRACHEOSTOMY     Family History  Problem Relation Age of Onset  . Diabetes Sister   . Ovarian cancer Sister   . Emphysema Paternal Grandmother    Social History   Tobacco Use  . Smoking status: Current Every Day Smoker    Types: E-cigarettes  . Smokeless tobacco: Former Systems developer    Quit date: 04/12/2011  . Tobacco comment: Quit smoking Cigarettes around 2013. Currently using vapes  Substance Use Topics  . Alcohol use: Yes    Comment: occ   . Drug use: No     Review of Systems  Constitutional: Negative.   HENT: Negative.   Eyes: Negative.   Respiratory: Negative.   Cardiovascular: Positive for leg swelling (occasionally at end of day when sitting for prolonged periods).  Gastrointestinal: Negative.   Endocrine: Negative.   Genitourinary: Negative.   Musculoskeletal:       Carpel tunnel symptoms, sees Gramig.  Allergic/Immunologic: Negative.   Neurological: Negative.   Hematological: Negative.     Psychiatric/Behavioral: Negative.        Objective:   Physical Exam Physical Exam  Constitutional: She is oriented to person, place, and time. She appears well-developed and well-nourished. No distress.  HENT:  Head: Normocephalic and atraumatic.  Right Ear: External ear normal.  Left Ear: External ear normal.  Nose: Nose normal.  Mouth/Throat: Oropharynx is clear and moist. No oropharyngeal exudate.  Eyes: Conjunctivae are normal. Pupils are equal, round, and reactive to light.  Neck: Normal range of motion. Neck supple. No JVD present. No thyromegaly present.  Cardiovascular: Normal rate, regular rhythm, normal heart sounds and intact distal pulses.   Pulmonary/Chest: Effort normal and breath sounds normal. Right breast exhibits no inverted nipple, no mass, no nipple discharge, no skin change and no tenderness. Left breast exhibits no inverted nipple, no mass, no nipple discharge, no skin change and no tenderness. Breasts are symmetrical.  Abdominal: Soft. Bowel sounds are normal. She exhibits no distension and no mass. There is no tenderness. There is no rebound and no guarding.  Genitourinary: Vagina normal. Pelvic exam was performed with patient supine. There is no rash, tenderness, lesion or injury on the right labia. There is no rash, tenderness, lesion or injury on the left labia. Cervix exhibits no motion tenderness and no discharge. No vaginal discharge found.  Musculoskeletal: Normal range of motion. She exhibits no edema or tenderness.  Lymphadenopathy:    She  has no cervical adenopathy.  Neurological: She is alert and oriented to person, place, and time. She has normal reflexes.  Skin: Skin is warm and dry. She is not diaphoretic.  Psychiatric: She has a normal mood and affect. Her behavior is normal. Judgment and thought content normal.  Vitals reviewed.     BP 110/78 (BP Location: Right Arm, Patient Position: Sitting, Cuff Size: Normal)   Pulse 67   Temp 97.8 F  (36.6 C) (Oral)   Ht 5\' 2"  (1.575 m)   Wt 195 lb 1.9 oz (88.5 kg)   SpO2 96%   BMI 35.69 kg/m  Wt Readings from Last 3 Encounters:  02/12/18 195 lb 1.9 oz (88.5 kg)  11/15/17 195 lb (88.5 kg)  06/16/16 212 lb 6.4 oz (96.3 kg)       Assessment & Plan:  1. Annual physical exam - Discussed and encouraged healthy lifestyle choices- adequate sleep, regular exercise, stress management and healthy food choices.   2.  Need for Tdap vaccination - Tdap vaccine greater than or equal to 7yo IM  3. Screening for cervical cancer - Cytology - PAP  4. Vitamin D deficiency - Vitamin D, Ergocalciferol, (DRISDOL) 50000 units CAPS capsule; Take 1 capsule (50,000 Units total) by mouth every 7 (seven) days.  Dispense: 12 capsule; Refill: 3 - Vitamin D, 25-hydroxy; Future  5. Screening for colon cancer - Ambulatory referral to Gastroenterology  6. Need for immunization against influenza - Flu Vaccine QUAD 6+ mos PF IM (Fluarix Quad PF)  8. Nicotine vapor product user - has cut down on usage significantly, encouraged her to continue to wean until quitting completely  9. Obesity - encouraged increased exercise, continue to work on healthy food choices, follow up with bariatric medicine as scheduled  Clarene Reamer, FNP-BC  [facility] at [facility], [place]  02/12/2018 9:30 AM

## 2018-02-12 NOTE — Patient Instructions (Signed)
Good to see you today  Please follow up in 6 months for a lab only visit to check your vitamin D- you do not need to fast  Follow up in 1 year for your annual exam  Keeping You Healthy  Get These Tests  Blood Pressure- Have your blood pressure checked by your healthcare provider at least once a year.  Normal blood pressure is 120/80.  Weight- Have your body mass index (BMI) calculated to screen for obesity.  BMI is a measure of body fat based on height and weight.  You can calculate your own BMI at [employer]  Cholesterol- Have your cholesterol checked every year.  Diabetes- Have your blood sugar checked every year if you have high blood pressure, high cholesterol, a family history of diabetes or if you are overweight.  Pap Test - Have a pap test every 1 to 5 years if you have been sexually active.  If you are older than 65 and recent pap tests have been normal you may not need additional pap tests.  In addition, if you have had a hysterectomy  for benign disease additional pap tests are not necessary.  Mammogram-Yearly mammograms are essential for early detection of breast cancer  Screening for Colon Cancer- Colonoscopy starting at age 46. Screening may begin sooner depending on your family history and other health conditions.  Follow up colonoscopy as directed by your Gastroenterologist.  Screening for Osteoporosis- Screening begins at age 39 with bone density scanning, sooner if you are at higher risk for developing Osteoporosis.  Get these medicines  Calcium with Vitamin D- Your body requires 1200-1500 mg of Calcium a day and [phone] IU of Vitamin D a day.  You can only absorb 500 mg of Calcium at a time therefore Calcium must be taken in 2 or 3 separate doses throughout the day.  Hormones- Hormone therapy has been associated with increased risk for certain cancers and heart disease.  Talk to your healthcare provider about if you need relief from menopausal  symptoms.  Aspirin- Ask your healthcare provider about taking Aspirin to prevent Heart Disease and Stroke.  Get these Immuniztions  Flu shot- Every fall  Pneumonia shot- Once after the age of 64; if you are younger ask your healthcare provider if you need a pneumonia shot.  Tetanus- Every ten years.  Zostavax- Once after the age of 51 to prevent shingles.  Take these steps  Don't smoke- Your healthcare provider can help you quit. For tips on how to quit, ask your healthcare provider or go to www.smokefree.gov or call 1-800 QUIT-NOW.  Be physically active- Exercise 5 days a week for a minimum of 30 minutes.  If you are not already physically active, start slow and gradually work up to 30 minutes of moderate physical activity.  Try walking, dancing, bike riding, swimming, etc.  Eat a healthy diet- Eat a variety of healthy foods such as fruits, vegetables, whole grains, low fat milk, low fat cheeses, yogurt, lean meats, chicken, fish, eggs, dried beans, tofu, etc.  For more information go to www.thenutritionsource.org  Dental visit- Brush and floss teeth twice daily; visit your dentist twice a year.  Eye exam- Visit your Optometrist or Ophthalmologist yearly.  Drink alcohol in moderation- Limit alcohol intake to one drink or less a day.  Never drink and drive.  Depression- Your emotional health is as important as your physical health.  If you're feeling down or losing interest in things you normally enjoy, please talk to your healthcare  provider.  Seat Belts- can save your life; always wear one  Smoke/Carbon Monoxide detectors- These detectors need to be installed on the appropriate level of your home.  Replace batteries at least once a year.  Violence- If anyone is threatening or hurting you, please tell your healthcare provider.  Living Will/ Health care power of attorney- Discuss with your healthcare provider and family.

## 2018-02-13 LAB — CYTOLOGY - PAP
DIAGNOSIS: NEGATIVE
HPV: NOT DETECTED

## 2018-02-22 ENCOUNTER — Encounter: Payer: Self-pay | Admitting: Gastroenterology

## 2018-03-21 ENCOUNTER — Ambulatory Visit (AMBULATORY_SURGERY_CENTER): Payer: 59

## 2018-03-21 VITALS — Ht 62.0 in | Wt 195.0 lb

## 2018-03-21 DIAGNOSIS — Z8371 Family history of colonic polyps: Secondary | ICD-10-CM

## 2018-03-21 MED ORDER — NA SULFATE-K SULFATE-MG SULF 17.5-3.13-1.6 GM/177ML PO SOLN: 1.0000 | Freq: Once | ORAL | 0 refills | Status: AC

## 2018-03-21 NOTE — Progress Notes (Signed)
Per pt, no allergies to soy or egg products.Pt not taking any weight loss meds or using  O2 at home.  Pt refused emmi video. 

## 2018-03-22 ENCOUNTER — Encounter: Payer: Self-pay | Admitting: Gastroenterology

## 2018-03-29 ENCOUNTER — Ambulatory Visit (AMBULATORY_SURGERY_CENTER): Payer: 59 | Admitting: Gastroenterology

## 2018-03-29 ENCOUNTER — Encounter: Payer: Self-pay | Admitting: Gastroenterology

## 2018-03-29 VITALS — BP 118/69 | HR 64 | Temp 98.0°F | Resp 22 | Ht 62.0 in | Wt 195.0 lb

## 2018-03-29 DIAGNOSIS — Z1211 Encounter for screening for malignant neoplasm of colon: Secondary | ICD-10-CM

## 2018-03-29 DIAGNOSIS — K635 Polyp of colon: Secondary | ICD-10-CM | POA: Diagnosis not present

## 2018-03-29 DIAGNOSIS — D125 Benign neoplasm of sigmoid colon: Secondary | ICD-10-CM

## 2018-03-29 IMAGING — CT CT ABD-PELV W/ CM
2 of 5 series · 7 of 46 positions shown, 8 images · IV contrast (iopamidol)
Comparison: Right upper quadrant ultrasound 02/19/2016

CLINICAL DATA: Right lower quadrant pain.

EXAM:
CT ABDOMEN AND PELVIS WITH CONTRAST
TECHNIQUE: Multidetector CT imaging of the abdomen and pelvis was performed
using the standard protocol following bolus administration of
intravenous contrast.
CONTRAST:  100mL UWUNXC-5KK IOPAMIDOL (UWUNXC-5KK) INJECTION 61%

[Series 201: routine, idose (2) · axial · 0.95mm/px · z∈[+198,+553]mm · 4 of 101 slices shown, 5 images]
[im 18/101  soft-tissue]
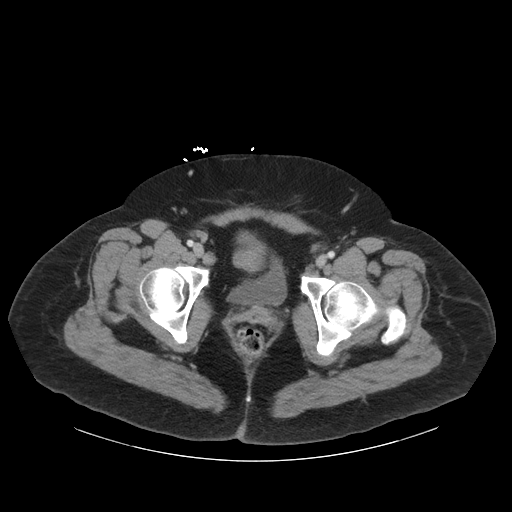
[im 18/101  bone]
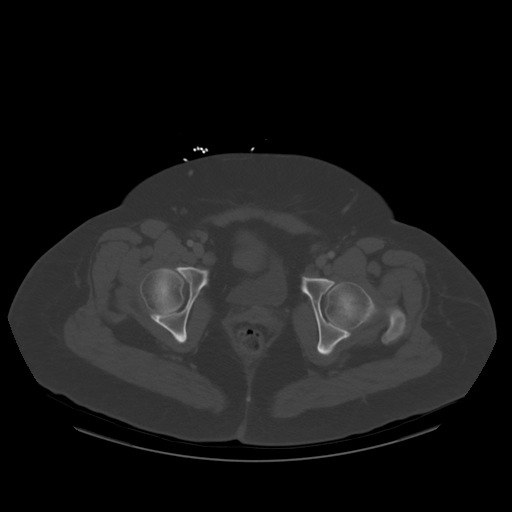
[im 42/101  soft-tissue]
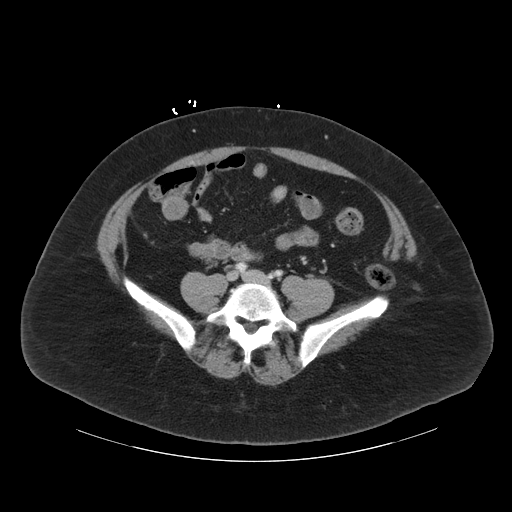
[im 65/101  soft-tissue]
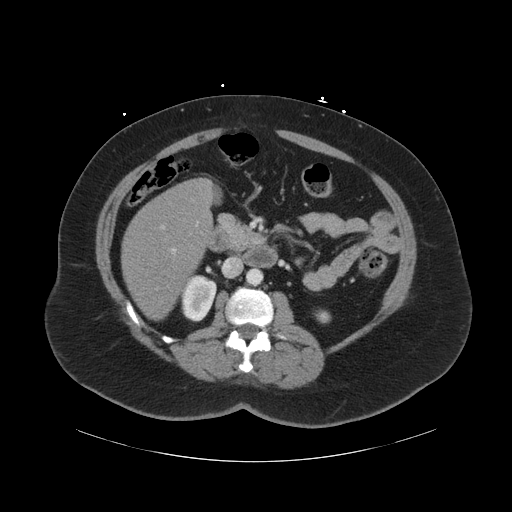
[im 89/101  soft-tissue]
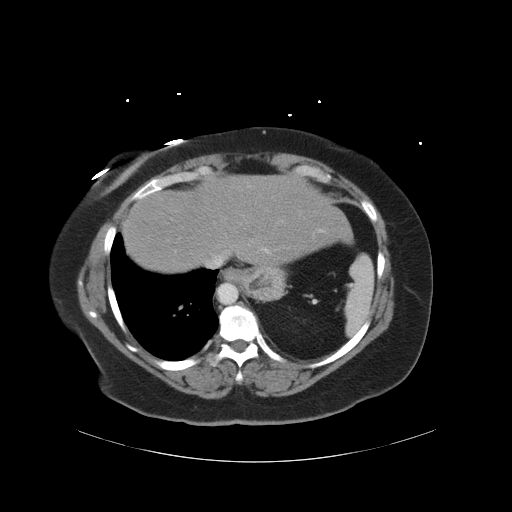

[Series 203: coronals, idose (2) · coronal · 0.45mm/px · 3 of 144 slices shown]
[im 48/144  soft-tissue]
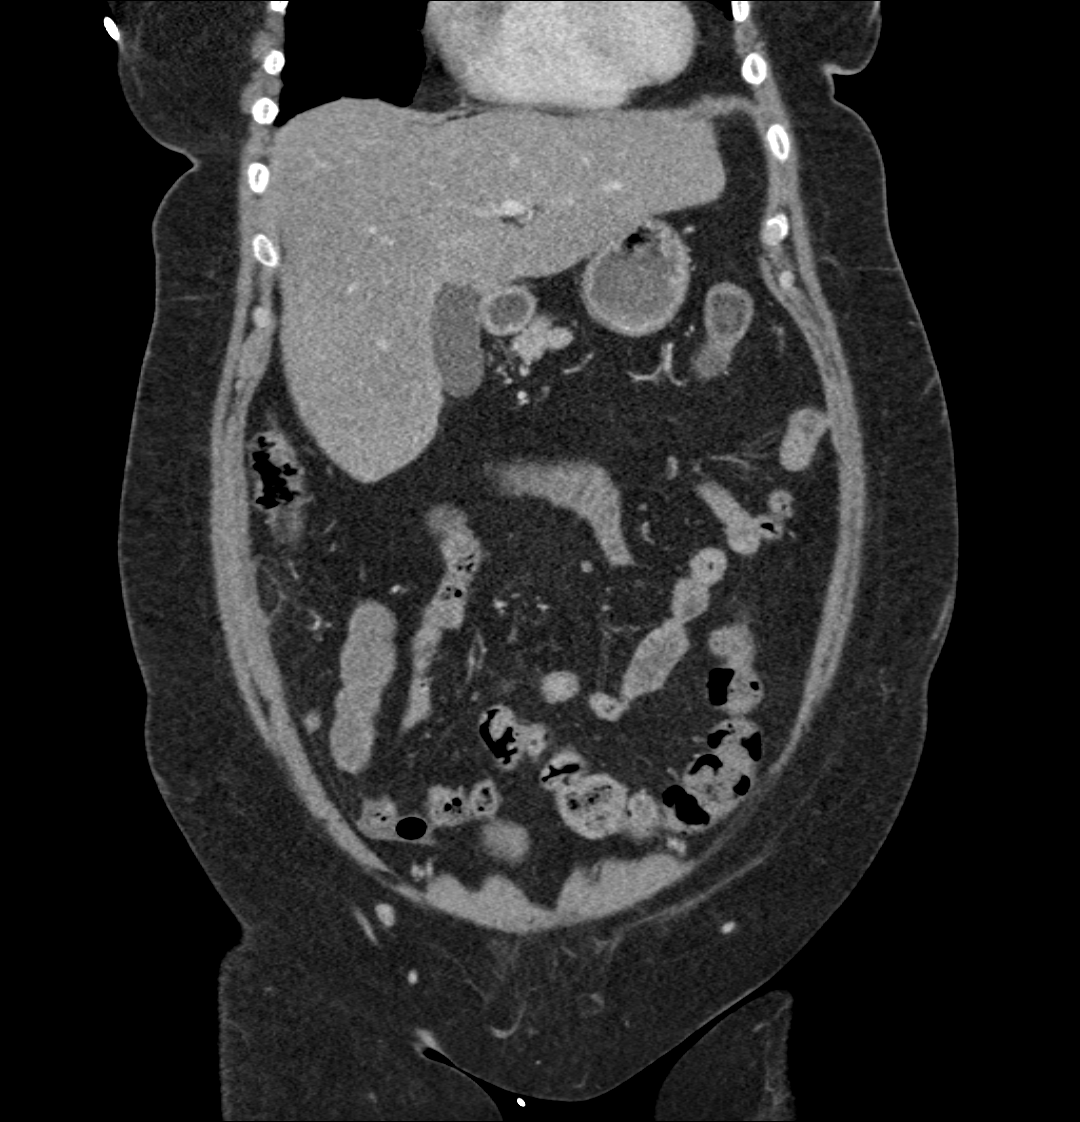
[im 64/144  soft-tissue]
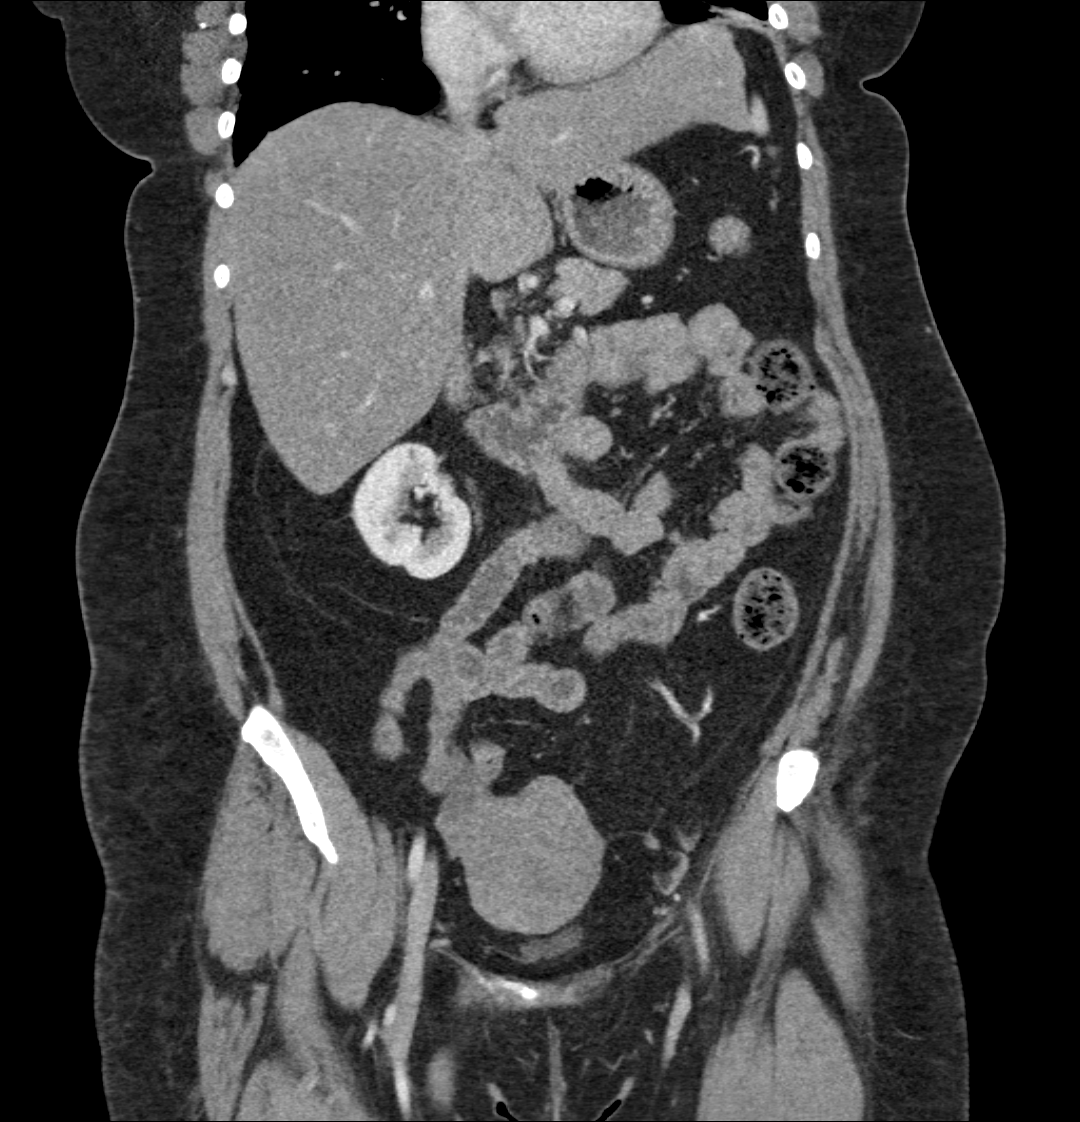
[im 80/144  soft-tissue]
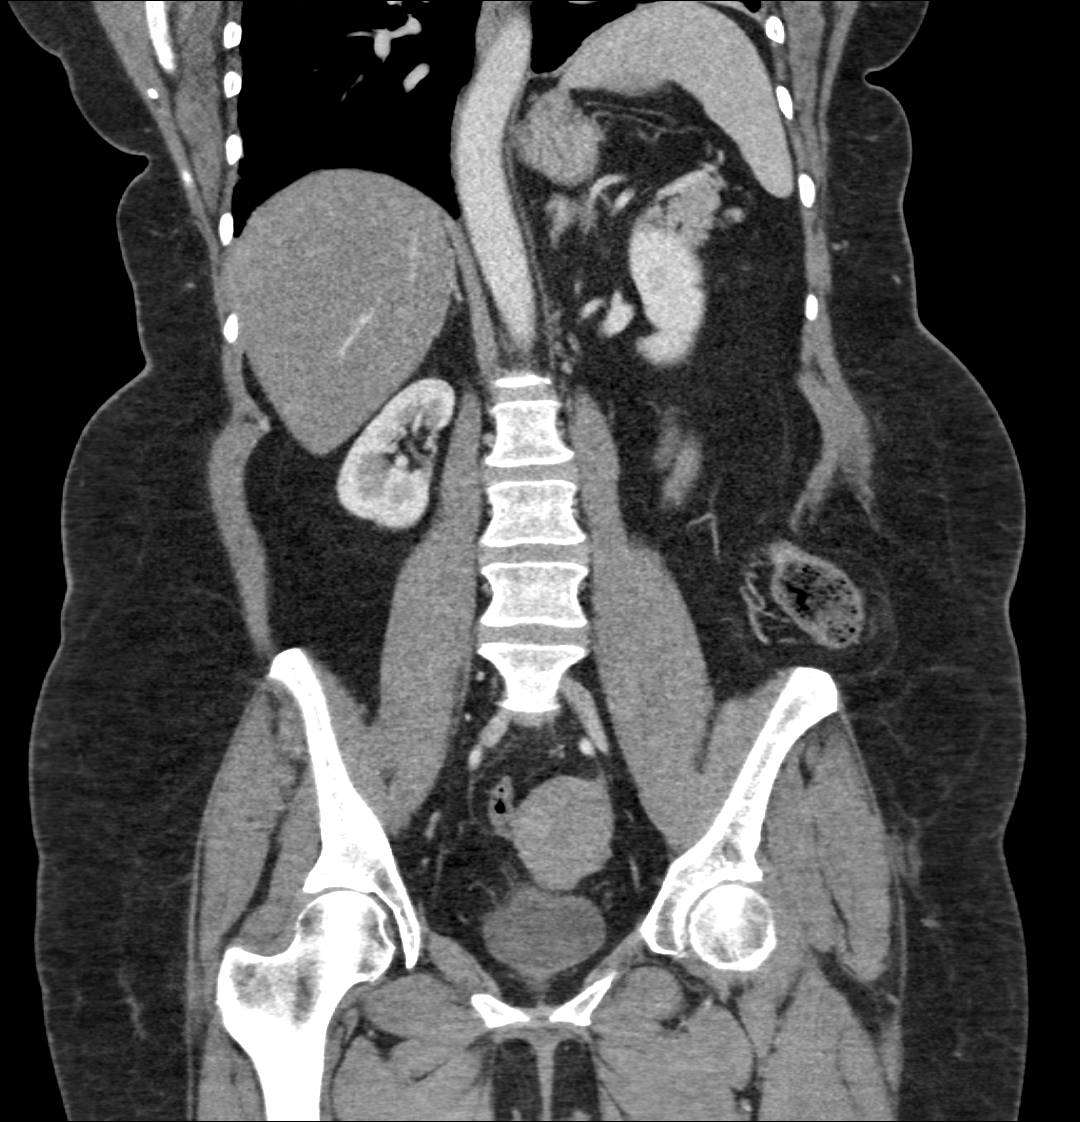

[7 of 46 positions shown; findings below may reference images not displayed]

FINDINGS: Lower chest: Elevation of the left hemidiaphragm with left basilar
scarring. Emphysematous change at the lung bases. Trachea
atelectasis in the right middle lobe.

Hepatobiliary: Decreased hepatic density consistent with steatosis.
No evidence of focal hepatic lesion. Gallbladder physiologically
distended, no calcified stone. No biliary dilatation.

Pancreas: No ductal dilatation or inflammation.

Spleen: Normal in size without focal abnormality.

Adrenals/Urinary Tract: No adrenal nodule. Symmetric renal
enhancement and excretion on delayed phase imaging. Cortical
scarring in the medial right and lateral left kidney. No
hydronephrosis or perinephric edema. Urinary bladder is minimally
distended.

Stomach/Bowel: The appendix is normal. There is a fat lobule about
the right pericolic gutter that may be an inflamed epiploic
appendage or fat necrosis. No associated colonic inflammation. Left
lumbar no small bowel dilatation or inflammation. Stomach is
physiologically distended. Hernia contains normal appearing
descending colon, no obstruction or wall thickening.

Vascular/Lymphatic: Mild aortic atherosclerosis without aneurysm. No
adenopathy.

Reproductive: Well-defined lobulated pelvic mass is just to the
right of midline measures 6.6 x 6.6 x 7.0 cm and is may be
contiguous with the uterine fundus. The left ovary is discretely
visualized and is normal. The right ovary is not confidently
identified.

Other: No free air, free fluid, or intra-abdominal fluid collection.
Small fat containing supraumbilical ventral abdominal wall hernia.

Musculoskeletal: There are no acute or suspicious osseous
abnormalities.
IMPRESSION: 1. Inflamed fat lobule on the right pericolic gutter, may be fat
necrosis versus epiploic appendagitis. This is likely cause of
patient's right-sided abdominal pain. The appendix is normal.
2. Lobular pelvic soft tissue mass. Differential considerations
include exophytic uterine fibroid versus right ovarian mass. No
prior exams for comparison. Recommend pelvic ultrasound for
characterization.
3. Left lumbar hernia contains nonobstructed noninflamed descending
colon.
4. Hepatic steatosis.
5. Abdominal aortic atherosclerosis.

## 2018-03-29 MED ORDER — SODIUM CHLORIDE 0.9 % IV SOLN: 500.0000 mL | Freq: Once | INTRAVENOUS | Status: DC

## 2018-03-29 NOTE — Progress Notes (Signed)
Report given to PACU, vss 

## 2018-03-29 NOTE — Patient Instructions (Signed)
Handout given for polyps.  YOU HAD AN ENDOSCOPIC PROCEDURE TODAY AT THE [facility]:   Refer to the procedure report that was given to you for any specific questions about what was found during the examination.  If the procedure report does not answer your questions, please call your gastroenterologist to clarify.  If you requested that your care partner not be given the details of your procedure findings, then the procedure report has been included in a sealed envelope for you to review at your convenience later.  YOU SHOULD EXPECT: Some feelings of bloating in the abdomen. Passage of more gas than usual.  Walking can help get rid of the air that was put into your GI tract during the procedure and reduce the bloating. If you had a lower endoscopy (such as a colonoscopy or flexible sigmoidoscopy) you may notice spotting of blood in your stool or on the toilet paper. If you underwent a bowel prep for your procedure, you may not have a normal bowel movement for a few days.  Please Note:  You might notice some irritation and congestion in your nose or some drainage.  This is from the oxygen used during your procedure.  There is no need for concern and it should clear up in a day or so.  SYMPTOMS TO REPORT IMMEDIATELY:   Following lower endoscopy (colonoscopy or flexible sigmoidoscopy):  Excessive amounts of blood in the stool  Significant tenderness or worsening of abdominal pains  Swelling of the abdomen that is new, acute  Fever of 100F or higher  For urgent or emergent issues, a gastroenterologist can be reached at any hour by calling [phone].   DIET:  We do recommend a small meal at first, but then you may proceed to your regular diet.  Drink plenty of fluids but you should avoid alcoholic beverages for 24 hours.  ACTIVITY:  You should plan to take it easy for the rest of today and you should NOT DRIVE or use heavy machinery until tomorrow (because of the sedation  medicines used during the test).    FOLLOW UP: Our staff will call the number listed on your records the next business day following your procedure to check on you and address any questions or concerns that you may have regarding the information given to you following your procedure. If we do not reach you, we will leave a message.  However, if you are feeling well and you are not experiencing any problems, there is no need to return our call.  We will assume that you have returned to your regular daily activities without incident.  If any biopsies were taken you will be contacted by phone or by letter within the next 1-3 weeks.  Please call us at [phone] if you have not heard about the biopsies in 3 weeks.    SIGNATURES/CONFIDENTIALITY: You and/or your care partner have signed paperwork which will be entered into your electronic medical record.  These signatures attest to the fact that that the information above on your After Visit Summary has been reviewed and is understood.  Full responsibility of the confidentiality of this discharge information lies with you and/or your care-partner. 

## 2018-03-29 NOTE — Progress Notes (Signed)
Called to room to assist during endoscopic procedure.  Patient ID and intended procedure confirmed with present staff. Received instructions for my participation in the procedure from the performing physician.  

## 2018-03-29 NOTE — Progress Notes (Signed)
Pt's states no medical or surgical changes since previsit or office visit. 

## 2018-03-29 NOTE — Op Note (Signed)
[facility] Patient Name: Betty Snyder Procedure Date: 03/29/2018 2:01 PM MRN: [id number] Endoscopist: Remo Lipps P. Havery Moros , MD Age: 52 Referring MD:  Date of Birth: [birth year] Gender: Female Account #: [id number] Procedure:                Colonoscopy Indications:              Screening for colorectal malignant neoplasm, This                            is the patient's first colonoscopy Medicines:                Monitored Anesthesia Care Procedure:                Pre-Anesthesia Assessment:                           - Prior to the procedure, a History and Physical                            was performed, and patient medications and                            allergies were reviewed. The patient's tolerance of                            previous anesthesia was also reviewed. The risks                            and benefits of the procedure and the sedation                            options and risks were discussed with the patient.                            All questions were answered, and informed consent                            was obtained. Prior Anticoagulants: The patient has                            taken no previous anticoagulant or antiplatelet                            agents. ASA Grade Assessment: I - A normal, healthy                            patient. After reviewing the risks and benefits,                            the patient was deemed in satisfactory condition to                            undergo the procedure.  After obtaining informed consent, the colonoscope                            was passed under direct vision. Throughout the                            procedure, the patient's blood pressure, pulse, and                            oxygen saturations were monitored continuously. The                            Colonoscope was introduced through the anus and                            advanced to the the cecum,  identified by                            appendiceal orifice and ileocecal valve. The                            colonoscopy was performed without difficulty. The                            patient tolerated the procedure well. The quality                            of the bowel preparation was adequate. The                            ileocecal valve, appendiceal orifice, and rectum                            were photographed. Scope In: 2:04:57 PM Scope Out: 2:20:52 PM Scope Withdrawal Time: 0 hours 13 minutes 19 seconds  Total Procedure Duration: 0 hours 15 minutes 55 seconds  Findings:                 The perianal and digital rectal examinations were                            normal.                           A 4 mm polyp was found in the sigmoid colon. The                            polyp was sessile. The polyp was removed with a                            cold snare. Resection and retrieval were complete.                           The prep in the right colon was only fair. Several  minutes taken to lavage that area to obtain                            adequate views. The exam was otherwise without                            abnormality. Complications:            No immediate complications. Estimated blood loss:                            Minimal. Estimated Blood Loss:     Estimated blood loss was minimal. Impression:               - One 4 mm polyp in the sigmoid colon, removed with                            a cold snare. Resected and retrieved.                           - The examination was otherwise normal. Recommendation:           - Patient has a contact number available for                            emergencies. The signs and symptoms of potential                            delayed complications were discussed with the                            patient. Return to normal activities tomorrow.                            Written discharge instructions  were provided to the                            patient.                           - Resume previous diet.                           - Continue present medications.                           - Await pathology results. Remo Lipps P. Clariza Sickman, MD 03/29/2018 2:25:00 PM This report has been signed electronically.

## 2018-03-30 ENCOUNTER — Telehealth: Payer: Self-pay

## 2018-03-30 NOTE — Telephone Encounter (Signed)
  Follow up Call-  Call back number 03/29/2018  Post procedure Call Back phone  # [phone]  Permission to leave phone message Yes  Some recent data might be hidden     Patient questions:  Do you have a fever, pain , or abdominal swelling? No. Pain Score  0 *  Have you tolerated food without any problems? Yes.    Have you been able to return to your normal activities? Yes.    Do you have any questions about your discharge instructions: Diet   No. Medications  No. Follow up visit  No.  Do you have questions or concerns about your Care? No.  Actions: * If pain score is 4 or above: No action needed, pain <4.

## 2018-05-03 ENCOUNTER — Encounter (INDEPENDENT_AMBULATORY_CARE_PROVIDER_SITE_OTHER): Payer: Self-pay

## 2018-05-08 ENCOUNTER — Ambulatory Visit (INDEPENDENT_AMBULATORY_CARE_PROVIDER_SITE_OTHER): Payer: 59 | Admitting: Family Medicine

## 2018-05-08 ENCOUNTER — Encounter (INDEPENDENT_AMBULATORY_CARE_PROVIDER_SITE_OTHER): Payer: Self-pay | Admitting: Family Medicine

## 2018-05-08 VITALS — BP 113/77 | HR 61 | Temp 97.7°F | Ht 62.0 in | Wt 190.0 lb

## 2018-05-08 DIAGNOSIS — Z6834 Body mass index (BMI) 34.0-34.9, adult: Secondary | ICD-10-CM

## 2018-05-08 DIAGNOSIS — R0602 Shortness of breath: Secondary | ICD-10-CM | POA: Diagnosis not present

## 2018-05-08 DIAGNOSIS — R5383 Other fatigue: Secondary | ICD-10-CM | POA: Diagnosis not present

## 2018-05-08 DIAGNOSIS — Z1331 Encounter for screening for depression: Secondary | ICD-10-CM | POA: Diagnosis not present

## 2018-05-08 DIAGNOSIS — Z0289 Encounter for other administrative examinations: Secondary | ICD-10-CM

## 2018-05-08 DIAGNOSIS — E559 Vitamin D deficiency, unspecified: Secondary | ICD-10-CM

## 2018-05-08 DIAGNOSIS — R739 Hyperglycemia, unspecified: Secondary | ICD-10-CM | POA: Diagnosis not present

## 2018-05-08 DIAGNOSIS — E782 Mixed hyperlipidemia: Secondary | ICD-10-CM

## 2018-05-08 DIAGNOSIS — E669 Obesity, unspecified: Secondary | ICD-10-CM

## 2018-05-08 DIAGNOSIS — Z9189 Other specified personal risk factors, not elsewhere classified: Secondary | ICD-10-CM | POA: Diagnosis not present

## 2018-05-09 LAB — COMPREHENSIVE METABOLIC PANEL
ALK PHOS: 73 IU/L (ref 39–117)
ALT: 18 IU/L (ref 0–32)
AST: 13 IU/L (ref 0–40)
Albumin/Globulin Ratio: 1.5 (ref 1.2–2.2)
Albumin: 4.5 g/dL (ref 3.8–4.9)
BUN/Creatinine Ratio: 15 (ref 9–23)
BUN: 12 mg/dL (ref 6–24)
Bilirubin Total: 0.3 mg/dL (ref 0.0–1.2)
CO2: 23 mmol/L (ref 20–29)
Calcium: 9.7 mg/dL (ref 8.7–10.2)
Chloride: 100 mmol/L (ref 96–106)
Creatinine, Ser: 0.79 mg/dL (ref 0.57–1.00)
GFR calc Af Amer: 99 mL/min/{1.73_m2} (ref >59)
GFR calc non Af Amer: 86 mL/min/{1.73_m2} (ref >59)
Globulin, Total: 3 g/dL (ref 1.5–4.5)
Glucose: 86 mg/dL (ref 65–99)
Potassium: 4.6 mmol/L (ref 3.5–5.2)
Sodium: 138 mmol/L (ref 134–144)
Total Protein: 7.5 g/dL (ref 6.0–8.5)

## 2018-05-09 LAB — CBC WITH DIFFERENTIAL
BASOS: 1 %
Basophils Absolute: 0 10*3/uL (ref 0.0–0.2)
EOS (ABSOLUTE): 0.1 10*3/uL (ref 0.0–0.4)
Eos: 2 %
Hematocrit: 43.3 % (ref 34.0–46.6)
Hemoglobin: 14.2 g/dL (ref 11.1–15.9)
Immature Grans (Abs): 0 10*3/uL (ref 0.0–0.1)
Immature Granulocytes: 0 %
Lymphocytes Absolute: 2.3 10*3/uL (ref 0.7–3.1)
Lymphs: 33 %
MCH: 29.6 pg (ref 26.6–33.0)
MCHC: 32.8 g/dL (ref 31.5–35.7)
MCV: 90 fL (ref 79–97)
Monocytes Absolute: 0.4 10*3/uL (ref 0.1–0.9)
Monocytes: 5 %
Neutrophils Absolute: 4 10*3/uL (ref 1.4–7.0)
Neutrophils: 59 %
RBC: 4.8 x10E6/uL (ref 3.77–5.28)
RDW: 12.4 % (ref 11.7–15.4)
WBC: 6.8 10*3/uL (ref 3.4–10.8)

## 2018-05-09 LAB — TSH: TSH: 2.62 u[IU]/mL (ref 0.450–4.500)

## 2018-05-09 LAB — LIPID PANEL WITH LDL/HDL RATIO
Cholesterol, Total: 238 mg/dL — ABNORMAL HIGH (ref 100–199)
HDL: 48 mg/dL (ref >39)
LDL Calculated: 146 mg/dL — ABNORMAL HIGH (ref 0–99)
LDl/HDL Ratio: 3 ratio (ref 0.0–3.2)
Triglycerides: 221 mg/dL — ABNORMAL HIGH (ref 0–149)
VLDL Cholesterol Cal: 44 mg/dL — ABNORMAL HIGH (ref 5–40)

## 2018-05-09 LAB — T3: T3, Total: 109 ng/dL (ref 71–180)

## 2018-05-09 LAB — VITAMIN B12: VITAMIN B 12: 454 pg/mL (ref 232–1245)

## 2018-05-09 LAB — T4, FREE: FREE T4: 1.05 ng/dL (ref 0.82–1.77)

## 2018-05-09 LAB — HEMOGLOBIN A1C
ESTIMATED AVERAGE GLUCOSE: 111 mg/dL
Hgb A1c MFr Bld: 5.5 % (ref 4.8–5.6)

## 2018-05-09 LAB — INSULIN, RANDOM: INSULIN: 8.3 u[IU]/mL (ref 2.6–24.9)

## 2018-05-09 LAB — FOLATE: Folate: 8.5 ng/mL (ref >3.0)

## 2018-05-09 LAB — VITAMIN D 25 HYDROXY (VIT D DEFICIENCY, FRACTURES): Vit D, 25-Hydroxy: 21 ng/mL — ABNORMAL LOW (ref 30.0–100.0)
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
## 2018-05-22 ENCOUNTER — Ambulatory Visit (INDEPENDENT_AMBULATORY_CARE_PROVIDER_SITE_OTHER): Payer: 59 | Admitting: Family Medicine

## 2018-05-22 ENCOUNTER — Encounter (INDEPENDENT_AMBULATORY_CARE_PROVIDER_SITE_OTHER): Payer: Self-pay | Admitting: Family Medicine

## 2018-05-22 VITALS — BP 146/71 | HR 64 | Ht 62.0 in | Wt 188.0 lb

## 2018-05-22 DIAGNOSIS — E559 Vitamin D deficiency, unspecified: Secondary | ICD-10-CM | POA: Diagnosis not present

## 2018-05-22 DIAGNOSIS — E8881 Metabolic syndrome: Secondary | ICD-10-CM | POA: Diagnosis not present

## 2018-05-22 DIAGNOSIS — E669 Obesity, unspecified: Secondary | ICD-10-CM

## 2018-05-22 DIAGNOSIS — Z9189 Other specified personal risk factors, not elsewhere classified: Secondary | ICD-10-CM

## 2018-05-22 DIAGNOSIS — E7849 Other hyperlipidemia: Secondary | ICD-10-CM

## 2018-05-22 DIAGNOSIS — Z6834 Body mass index (BMI) 34.0-34.9, adult: Secondary | ICD-10-CM
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
## 2018-05-24 MED ORDER — VITAMIN D 125 MCG (5000 UT) PO CAPS: 5000.0000 [IU] | ORAL_CAPSULE | Freq: Every day | ORAL | 0 refills | Status: DC

## 2018-05-26 ENCOUNTER — Encounter (INDEPENDENT_AMBULATORY_CARE_PROVIDER_SITE_OTHER): Payer: Self-pay | Admitting: Family Medicine

## 2018-06-01 ENCOUNTER — Encounter (INDEPENDENT_AMBULATORY_CARE_PROVIDER_SITE_OTHER): Payer: Self-pay | Admitting: Family Medicine

## 2018-06-06 ENCOUNTER — Encounter (INDEPENDENT_AMBULATORY_CARE_PROVIDER_SITE_OTHER): Payer: Self-pay | Admitting: Family Medicine

## 2018-06-06 ENCOUNTER — Ambulatory Visit (INDEPENDENT_AMBULATORY_CARE_PROVIDER_SITE_OTHER): Payer: 59 | Admitting: Family Medicine

## 2018-06-06 VITALS — BP 117/75 | HR 63 | Temp 98.1°F | Ht 62.0 in | Wt 189.0 lb

## 2018-06-06 DIAGNOSIS — Z6834 Body mass index (BMI) 34.0-34.9, adult: Secondary | ICD-10-CM

## 2018-06-06 DIAGNOSIS — E559 Vitamin D deficiency, unspecified: Secondary | ICD-10-CM

## 2018-06-06 DIAGNOSIS — E669 Obesity, unspecified: Secondary | ICD-10-CM
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
## 2018-06-07 ENCOUNTER — Ambulatory Visit (INDEPENDENT_AMBULATORY_CARE_PROVIDER_SITE_OTHER): Payer: 59 | Admitting: Family Medicine

## 2018-06-08 ENCOUNTER — Encounter: Payer: Self-pay | Admitting: Family Medicine

## 2018-06-08 ENCOUNTER — Encounter (INDEPENDENT_AMBULATORY_CARE_PROVIDER_SITE_OTHER): Payer: Self-pay | Admitting: Family Medicine

## 2018-06-27 ENCOUNTER — Ambulatory Visit (INDEPENDENT_AMBULATORY_CARE_PROVIDER_SITE_OTHER): Payer: 59 | Admitting: Family Medicine

## 2018-06-27 ENCOUNTER — Encounter (INDEPENDENT_AMBULATORY_CARE_PROVIDER_SITE_OTHER): Payer: Self-pay | Admitting: Family Medicine

## 2018-06-27 ENCOUNTER — Other Ambulatory Visit: Payer: Self-pay

## 2018-06-27 VITALS — BP 103/70 | HR 67 | Ht 62.0 in | Wt 184.0 lb

## 2018-06-27 DIAGNOSIS — Z711 Person with feared health complaint in whom no diagnosis is made: Secondary | ICD-10-CM

## 2018-06-27 DIAGNOSIS — Z6833 Body mass index (BMI) 33.0-33.9, adult: Secondary | ICD-10-CM

## 2018-06-27 DIAGNOSIS — Z9189 Other specified personal risk factors, not elsewhere classified: Secondary | ICD-10-CM

## 2018-06-27 DIAGNOSIS — E559 Vitamin D deficiency, unspecified: Secondary | ICD-10-CM | POA: Diagnosis not present

## 2018-06-27 DIAGNOSIS — E669 Obesity, unspecified: Secondary | ICD-10-CM
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
## 2018-06-28 MED ORDER — VITAMIN D (ERGOCALCIFEROL) 1.25 MG (50000 UNIT) PO CAPS: 50000.0000 [IU] | ORAL_CAPSULE | ORAL | 0 refills | Status: DC

## 2018-07-03 ENCOUNTER — Encounter (INDEPENDENT_AMBULATORY_CARE_PROVIDER_SITE_OTHER): Payer: Self-pay

## 2018-07-07 ENCOUNTER — Encounter (INDEPENDENT_AMBULATORY_CARE_PROVIDER_SITE_OTHER): Payer: Self-pay | Admitting: Family Medicine

## 2018-07-09 ENCOUNTER — Encounter (INDEPENDENT_AMBULATORY_CARE_PROVIDER_SITE_OTHER): Payer: Self-pay

## 2018-07-11 ENCOUNTER — Ambulatory Visit (INDEPENDENT_AMBULATORY_CARE_PROVIDER_SITE_OTHER): Payer: 59 | Admitting: Family Medicine

## 2018-07-25 ENCOUNTER — Encounter (INDEPENDENT_AMBULATORY_CARE_PROVIDER_SITE_OTHER): Payer: Self-pay | Admitting: Family Medicine

## 2018-07-25 ENCOUNTER — Other Ambulatory Visit: Payer: Self-pay

## 2018-07-25 ENCOUNTER — Ambulatory Visit (INDEPENDENT_AMBULATORY_CARE_PROVIDER_SITE_OTHER): Payer: 59 | Admitting: Family Medicine

## 2018-07-25 DIAGNOSIS — F3289 Other specified depressive episodes: Secondary | ICD-10-CM

## 2018-07-25 DIAGNOSIS — Z6833 Body mass index (BMI) 33.0-33.9, adult: Secondary | ICD-10-CM

## 2018-07-25 DIAGNOSIS — E8881 Metabolic syndrome: Secondary | ICD-10-CM | POA: Diagnosis not present

## 2018-07-25 DIAGNOSIS — E559 Vitamin D deficiency, unspecified: Secondary | ICD-10-CM | POA: Diagnosis not present

## 2018-07-25 DIAGNOSIS — E669 Obesity, unspecified: Secondary | ICD-10-CM | POA: Diagnosis not present

## 2018-07-25 MED ORDER — VITAMIN D (ERGOCALCIFEROL) 1.25 MG (50000 UNIT) PO CAPS: 50000.0000 [IU] | ORAL_CAPSULE | ORAL | 0 refills | Status: DC
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
## 2018-07-26 ENCOUNTER — Encounter (INDEPENDENT_AMBULATORY_CARE_PROVIDER_SITE_OTHER): Payer: Self-pay | Admitting: Family Medicine

## 2018-07-26 DIAGNOSIS — F418 Other specified anxiety disorders: Secondary | ICD-10-CM | POA: Insufficient documentation

## 2018-07-26 DIAGNOSIS — E662 Morbid (severe) obesity with alveolar hypoventilation: Secondary | ICD-10-CM | POA: Insufficient documentation

## 2018-07-26 DIAGNOSIS — E669 Obesity, unspecified: Secondary | ICD-10-CM | POA: Insufficient documentation

## 2018-07-26 DIAGNOSIS — Z6833 Body mass index (BMI) 33.0-33.9, adult: Secondary | ICD-10-CM

## 2018-07-26 DIAGNOSIS — E8881 Metabolic syndrome: Secondary | ICD-10-CM | POA: Insufficient documentation

## 2018-07-26 DIAGNOSIS — F329 Major depressive disorder, single episode, unspecified: Secondary | ICD-10-CM

## 2018-07-26 DIAGNOSIS — E88819 Insulin resistance, unspecified: Secondary | ICD-10-CM | POA: Insufficient documentation

## 2018-08-08 ENCOUNTER — Encounter (INDEPENDENT_AMBULATORY_CARE_PROVIDER_SITE_OTHER): Payer: Self-pay | Admitting: Family Medicine

## 2018-08-08 ENCOUNTER — Other Ambulatory Visit: Payer: Self-pay

## 2018-08-08 ENCOUNTER — Ambulatory Visit (INDEPENDENT_AMBULATORY_CARE_PROVIDER_SITE_OTHER): Payer: 59 | Admitting: Family Medicine

## 2018-08-08 DIAGNOSIS — E8881 Metabolic syndrome: Secondary | ICD-10-CM

## 2018-08-08 DIAGNOSIS — E669 Obesity, unspecified: Secondary | ICD-10-CM | POA: Diagnosis not present

## 2018-08-08 DIAGNOSIS — Z6833 Body mass index (BMI) 33.0-33.9, adult: Secondary | ICD-10-CM | POA: Diagnosis not present

## 2018-08-08 DIAGNOSIS — E559 Vitamin D deficiency, unspecified: Secondary | ICD-10-CM

## 2018-08-08 MED ORDER — VITAMIN D (ERGOCALCIFEROL) 1.25 MG (50000 UNIT) PO CAPS: 50000.0000 [IU] | ORAL_CAPSULE | ORAL | 0 refills | Status: DC

## 2018-08-09 ENCOUNTER — Encounter (INDEPENDENT_AMBULATORY_CARE_PROVIDER_SITE_OTHER): Payer: Self-pay | Admitting: Family Medicine

## 2018-08-09 ENCOUNTER — Telehealth: Payer: Self-pay | Admitting: Family Medicine

## 2018-08-09 NOTE — Telephone Encounter (Signed)
Not sure if we are able to answer that since we did not order labs? Thoughts?

## 2018-08-09 NOTE — Telephone Encounter (Signed)
Best number [phone] Pt called stating she had labs done @ dr Leafy Ro 1/28 she wanted to know if she still needs labs and if insurance will pay
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
## 2018-08-10 NOTE — Telephone Encounter (Signed)
Please call patient and tell her that if she is currently following up with weight management, she does not need follow up at this time, can wait until November unless she has any concerns. She does not need labs at this time since she just had them in January.

## 2018-08-10 NOTE — Telephone Encounter (Signed)
Called and spoke with pt. Pt advised and voiced understanding. Lab appt for 5/4 was canceled per requested by pt since it is not needed.

## 2018-08-13 ENCOUNTER — Other Ambulatory Visit: Payer: 59

## 2018-08-23 ENCOUNTER — Ambulatory Visit (INDEPENDENT_AMBULATORY_CARE_PROVIDER_SITE_OTHER): Payer: Self-pay | Admitting: Family Medicine

## 2018-09-04 ENCOUNTER — Other Ambulatory Visit (INDEPENDENT_AMBULATORY_CARE_PROVIDER_SITE_OTHER): Payer: Self-pay | Admitting: Family Medicine

## 2018-09-04 ENCOUNTER — Other Ambulatory Visit (INDEPENDENT_AMBULATORY_CARE_PROVIDER_SITE_OTHER): Payer: Self-pay

## 2018-09-04 DIAGNOSIS — E559 Vitamin D deficiency, unspecified: Secondary | ICD-10-CM

## 2018-09-04 MED ORDER — VITAMIN D (ERGOCALCIFEROL) 1.25 MG (50000 UNIT) PO CAPS: 50000.0000 [IU] | ORAL_CAPSULE | ORAL | 0 refills | Status: DC

## 2018-09-10 ENCOUNTER — Other Ambulatory Visit: Payer: Self-pay

## 2018-09-10 ENCOUNTER — Ambulatory Visit: Payer: 59 | Admitting: Family Medicine

## 2018-09-10 ENCOUNTER — Encounter: Payer: Self-pay | Admitting: Family Medicine

## 2018-09-10 VITALS — BP 130/62 | HR 64 | Temp 98.6°F | Resp 16 | Ht 62.0 in | Wt 190.8 lb

## 2018-09-10 DIAGNOSIS — N393 Stress incontinence (female) (male): Secondary | ICD-10-CM | POA: Insufficient documentation

## 2018-09-10 DIAGNOSIS — N950 Postmenopausal bleeding: Secondary | ICD-10-CM

## 2018-09-10 DIAGNOSIS — R351 Nocturia: Secondary | ICD-10-CM

## 2018-09-10 DIAGNOSIS — D250 Submucous leiomyoma of uterus: Secondary | ICD-10-CM | POA: Diagnosis not present

## 2018-09-10 LAB — POC URINALSYSI DIPSTICK (AUTOMATED)
Bilirubin, UA: NEGATIVE
Blood, UA: NEGATIVE
Glucose, UA: NEGATIVE
Ketones, UA: NEGATIVE
Leukocytes, UA: NEGATIVE
Nitrite, UA: NEGATIVE
Protein, UA: NEGATIVE
Spec Grav, UA: 1.015 (ref 1.010–1.025)
Urobilinogen, UA: 0.2 E.U./dL
pH, UA: 6 (ref 5.0–8.0)
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
## 2018-09-10 NOTE — Patient Instructions (Addendum)
Good to see you today, I have put in a referral for you to see a gynecologist, they will likely do an ultrasound.   Your urinalysis looked good. Try to drink most of your liquids early in the day and avoid drinking much after dinner.    Kegel Exercises Kegel exercises help strengthen the muscles that support the rectum, vagina, small intestine, bladder, and uterus. Doing Kegel exercises can help:  Improve bladder and bowel control.  Improve sexual response.  Reduce problems and discomfort during pregnancy. Kegel exercises involve squeezing your pelvic floor muscles, which are the same muscles you squeeze when you try to stop the flow of urine. The exercises can be done while sitting, standing, or lying down, but it is best to vary your position. Exercises 1. Squeeze your pelvic floor muscles tight. You should feel a tight lift in your rectal area. If you are a female, you should also feel a tightness in your vaginal area. Keep your stomach, buttocks, and legs relaxed. 2. Hold the muscles tight for up to 10 seconds. 3. Relax your muscles. Repeat this exercise 50 times a day or as many times as told by your health care provider. Continue to do this exercise for at least 4-6 weeks or for as long as told by your health care provider. This information is not intended to replace advice given to you by your health care provider. Make sure you discuss any questions you have with your health care provider. Document Released: 03/14/2012 Document Revised: 08/08/2016 Document Reviewed: 02/15/2015 Elsevier Interactive Patient Education  2019 [employer].

## 2018-09-20 ENCOUNTER — Ambulatory Visit (INDEPENDENT_AMBULATORY_CARE_PROVIDER_SITE_OTHER): Payer: 59 | Admitting: Family Medicine

## 2018-09-20 ENCOUNTER — Other Ambulatory Visit: Payer: Self-pay

## 2018-09-20 ENCOUNTER — Encounter (INDEPENDENT_AMBULATORY_CARE_PROVIDER_SITE_OTHER): Payer: Self-pay | Admitting: Family Medicine

## 2018-09-20 VITALS — BP 113/63 | HR 72 | Temp 98.8°F | Ht 62.0 in | Wt 187.0 lb

## 2018-09-20 DIAGNOSIS — E7849 Other hyperlipidemia: Secondary | ICD-10-CM

## 2018-09-20 DIAGNOSIS — E559 Vitamin D deficiency, unspecified: Secondary | ICD-10-CM | POA: Diagnosis not present

## 2018-09-20 DIAGNOSIS — Z9189 Other specified personal risk factors, not elsewhere classified: Secondary | ICD-10-CM | POA: Diagnosis not present

## 2018-09-20 DIAGNOSIS — E669 Obesity, unspecified: Secondary | ICD-10-CM

## 2018-09-20 DIAGNOSIS — E8881 Metabolic syndrome: Secondary | ICD-10-CM

## 2018-09-20 DIAGNOSIS — Z6834 Body mass index (BMI) 34.0-34.9, adult: Secondary | ICD-10-CM

## 2018-09-20 NOTE — Progress Notes (Signed)
You can substitute these foods for 2 ounces of protein: 1 Greek yogurt 2 eggs 2 oz of beef or [place] jerky (plain) 2 light string cheeses or slices of light cheese  cup cottage cheese 1 cup [employer] skim milk

## 2018-09-24 ENCOUNTER — Encounter (INDEPENDENT_AMBULATORY_CARE_PROVIDER_SITE_OTHER): Payer: Self-pay | Admitting: Family Medicine
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
## 2018-09-27 ENCOUNTER — Other Ambulatory Visit: Payer: Self-pay

## 2018-09-27 ENCOUNTER — Ambulatory Visit: Payer: 59 | Admitting: Obstetrics and Gynecology

## 2018-09-27 ENCOUNTER — Encounter: Payer: Self-pay | Admitting: Obstetrics and Gynecology

## 2018-09-27 VITALS — BP 112/62 | HR 65 | Ht 62.0 in | Wt 188.0 lb

## 2018-09-27 DIAGNOSIS — N950 Postmenopausal bleeding: Secondary | ICD-10-CM
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
## 2018-10-08 ENCOUNTER — Encounter (INDEPENDENT_AMBULATORY_CARE_PROVIDER_SITE_OTHER): Payer: Self-pay | Admitting: Family Medicine

## 2018-10-08 ENCOUNTER — Other Ambulatory Visit: Payer: Self-pay

## 2018-10-08 ENCOUNTER — Ambulatory Visit (INDEPENDENT_AMBULATORY_CARE_PROVIDER_SITE_OTHER): Payer: 59 | Admitting: Obstetrics and Gynecology

## 2018-10-08 ENCOUNTER — Telehealth: Payer: Self-pay | Admitting: Obstetrics and Gynecology

## 2018-10-08 ENCOUNTER — Encounter: Payer: Self-pay | Admitting: Obstetrics and Gynecology

## 2018-10-08 ENCOUNTER — Ambulatory Visit (INDEPENDENT_AMBULATORY_CARE_PROVIDER_SITE_OTHER): Payer: 59

## 2018-10-08 VITALS — BP 118/70 | HR 68 | Ht 61.0 in | Wt 189.0 lb

## 2018-10-08 DIAGNOSIS — N950 Postmenopausal bleeding: Secondary | ICD-10-CM

## 2018-10-08 DIAGNOSIS — R9389 Abnormal findings on diagnostic imaging of other specified body structures: Secondary | ICD-10-CM

## 2018-10-08 DIAGNOSIS — D252 Subserosal leiomyoma of uterus: Secondary | ICD-10-CM | POA: Diagnosis not present
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
## 2018-10-08 NOTE — Telephone Encounter (Signed)
-----   Message from Homero Fellers, MD sent at 10/08/2018 10:49 AM EDT ----- Surgery Booking Request Patient Full Name:  Betty Snyder  MRN: [id number]  DOB: 1965-11-25  Surgeon: Homero Fellers, MD  Requested Surgery Date and Time: July 2020 Primary Diagnosis AND Code: postmenopausal bleeding Secondary Diagnosis and Code:  Surgical Procedure: Hysteroscopy, d&c, possible polypectomy L&D Notification: No Admission Status: same day surgery Length of Surgery: 1 hour Special Case Needs: myosure available H&P: can do in preop (date) Phone Interview???: yes Interpreter: Language:  Medical Clearance: no Special Scheduling Instructions: none Acuity: P3

## 2018-10-08 NOTE — Telephone Encounter (Signed)
Patient is aware of H&P on day of surgery, Pre-admit Testing phone interview to be scheduled, COVID testing on 10/26/18 @ 8-10:30am, and OR on 10/30/18. Patient is aware she will be asked to quarantine after COVID testing. Patient is aware she may receive calls from the [facility] and [facility]. Patient confirmed [employer] and no secondary insurance. Patient is aware of no visitors at [facility].

## 2018-10-16 ENCOUNTER — Encounter (INDEPENDENT_AMBULATORY_CARE_PROVIDER_SITE_OTHER): Payer: Self-pay | Admitting: Family Medicine

## 2018-10-16 ENCOUNTER — Other Ambulatory Visit: Payer: Self-pay

## 2018-10-16 ENCOUNTER — Ambulatory Visit (INDEPENDENT_AMBULATORY_CARE_PROVIDER_SITE_OTHER): Payer: 59 | Admitting: Family Medicine

## 2018-10-16 VITALS — BP 102/69 | HR 72 | Temp 97.9°F | Ht 62.0 in | Wt 184.0 lb

## 2018-10-16 DIAGNOSIS — F418 Other specified anxiety disorders: Secondary | ICD-10-CM

## 2018-10-16 DIAGNOSIS — Z9189 Other specified personal risk factors, not elsewhere classified: Secondary | ICD-10-CM | POA: Diagnosis not present

## 2018-10-16 DIAGNOSIS — E559 Vitamin D deficiency, unspecified: Secondary | ICD-10-CM | POA: Diagnosis not present

## 2018-10-16 DIAGNOSIS — Z6833 Body mass index (BMI) 33.0-33.9, adult: Secondary | ICD-10-CM

## 2018-10-16 DIAGNOSIS — E7849 Other hyperlipidemia: Secondary | ICD-10-CM | POA: Diagnosis not present

## 2018-10-16 DIAGNOSIS — E669 Obesity, unspecified: Secondary | ICD-10-CM

## 2018-10-16 DIAGNOSIS — E8881 Metabolic syndrome: Secondary | ICD-10-CM | POA: Diagnosis not present

## 2018-10-16 MED ORDER — ESCITALOPRAM OXALATE 10 MG PO TABS: 10.0000 mg | ORAL_TABLET | ORAL | 0 refills | Status: DC

## 2018-10-16 MED ORDER — VITAMIN D (ERGOCALCIFEROL) 1.25 MG (50000 UNIT) PO CAPS: 50000.0000 [IU] | ORAL_CAPSULE | ORAL | 0 refills | Status: DC
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
## 2018-10-17 LAB — LIPID PANEL WITH LDL/HDL RATIO
Cholesterol, Total: 204 mg/dL — ABNORMAL HIGH (ref 100–199)
HDL: 38 mg/dL — ABNORMAL LOW (ref >39)
LDL Calculated: 120 mg/dL — ABNORMAL HIGH (ref 0–99)
LDl/HDL Ratio: 3.2 ratio (ref 0.0–3.2)
Triglycerides: 229 mg/dL — ABNORMAL HIGH (ref 0–149)
VLDL Cholesterol Cal: 46 mg/dL — ABNORMAL HIGH (ref 5–40)

## 2018-10-17 LAB — VITAMIN D 25 HYDROXY (VIT D DEFICIENCY, FRACTURES): Vit D, 25-Hydroxy: 33.1 ng/mL (ref 30.0–100.0)

## 2018-10-17 LAB — COMPREHENSIVE METABOLIC PANEL
ALT: 21 IU/L (ref 0–32)
AST: 15 IU/L (ref 0–40)
Albumin/Globulin Ratio: 1.6 (ref 1.2–2.2)
Albumin: 4.5 g/dL (ref 3.8–4.9)
Alkaline Phosphatase: 59 IU/L (ref 39–117)
BUN/Creatinine Ratio: 16 (ref 9–23)
BUN: 13 mg/dL (ref 6–24)
Bilirubin Total: 0.3 mg/dL (ref 0.0–1.2)
CO2: 22 mmol/L (ref 20–29)
Calcium: 9.4 mg/dL (ref 8.7–10.2)
Chloride: 103 mmol/L (ref 96–106)
Creatinine, Ser: 0.8 mg/dL (ref 0.57–1.00)
GFR calc Af Amer: 97 mL/min/{1.73_m2} (ref >59)
GFR calc non Af Amer: 84 mL/min/{1.73_m2} (ref >59)
Globulin, Total: 2.9 g/dL (ref 1.5–4.5)
Glucose: 91 mg/dL (ref 65–99)
Potassium: 4.7 mmol/L (ref 3.5–5.2)
Sodium: 141 mmol/L (ref 134–144)
Total Protein: 7.4 g/dL (ref 6.0–8.5)

## 2018-10-17 LAB — HEMOGLOBIN A1C
Est. average glucose Bld gHb Est-mCnc: 114 mg/dL
Hgb A1c MFr Bld: 5.6 % (ref 4.8–5.6)

## 2018-10-17 LAB — INSULIN, RANDOM: INSULIN: 12 u[IU]/mL (ref 2.6–24.9)

## 2018-10-23 ENCOUNTER — Encounter
Admission: RE | Admit: 2018-10-23 | Discharge: 2018-10-23 | Disposition: A | Discharge status: Home / Self Care | Payer: 59 | Source: Ambulatory Visit | Attending: Obstetrics and Gynecology | Admitting: Obstetrics and Gynecology

## 2018-10-23 ENCOUNTER — Other Ambulatory Visit: Payer: Self-pay

## 2018-10-23 NOTE — Patient Instructions (Signed)
Your procedure is scheduled on: 10-30-18 TUESDAY Report to Same Day Surgery 2nd floor medical mall [facility] Entrance-take elevator on left to 2nd floor.  Check in with surgery information desk.) To find out your arrival time please call [phone] between 1PM - 3PM on 10-29-18 MONDAY  Remember: Instructions that are not followed completely may result in serious medical risk, up to and including death, or upon the discretion of your surgeon and anesthesiologist your surgery may need to be rescheduled.    _x___ 1. Do not eat food after midnight the night before your procedure. NO GUM OR CANDY AFTER MIDNIGHT. You may drink clear liquids up to 2 hours before you are scheduled to arrive at the hospital for your procedure.  Do not drink clear liquids within 2 hours of your scheduled arrival to the hospital.  Clear liquids include  --Water or Apple juice without pulp  --Clear carbohydrate beverage such as ClearFast or Gatorade  --Black Coffee or Clear Tea (No milk, no creamers, do not add anything to the coffee or Tea   ____Ensure clear carbohydrate drink on the way to the hospital for bariatric patients  ____Ensure clear carbohydrate drink 3 hours before surgery for Dr Dwyane Luo patients if physician instructed.     __x__ 2. No Alcohol for 24 hours before or after surgery.   __x__3. No Smoking or e-cigarettes for 24 prior to surgery.  Do not use any chewable tobacco products for at least 6 hour prior to surgery   ____  4. Bring all medications with you on the day of surgery if instructed.    __x__ 5. Notify your doctor if there is any change in your medical condition     (cold, fever, infections).    x___6. On the morning of surgery brush your teeth with toothpaste and water.  You may rinse your mouth with mouth wash if you wish.  Do not swallow any toothpaste or mouthwash.   Do not wear jewelry, make-up, hairpins, clips or nail polish.  Do not wear lotions, powders, or perfumes. You  may wear deodorant.  Do not shave 48 hours prior to surgery. Men may shave face and neck.  Do not bring valuables to the hospital.    [facility] is not responsible for any belongings or valuables.               Contacts, dentures or bridgework may not be worn into surgery.  Leave your suitcase in the car. After surgery it may be brought to your room.  For patients admitted to the hospital, discharge time is determined by your treatment team.  _  Patients discharged the day of surgery will not be allowed to drive home.  You will need someone to drive you home and stay with you the night of your procedure.    Please read over the following fact sheets that you were given:   [facility] Preparing for Surgery  ____ Take anti-hypertensive listed below, cardiac, seizure, asthma, anti-reflux and psychiatric medicines. These include:  1. NONE  2.  3.  4.  5.  6.  ____Fleets enema or Magnesium Citrate as directed.   ____ Use CHG Soap or sage wipes as directed on instruction sheet   ____ Use inhalers on the day of surgery and bring to hospital day of surgery  ____ Stop Metformin and Janumet 2 days prior to surgery.    ____ Take 1/2 of usual insulin dose the night before surgery and none on the  morning surgery.   ____ Follow recommendations from Cardiologist, Pulmonologist or PCP regarding stopping Aspirin, Coumadin, Plavix ,Eliquis, Effient, or Pradaxa, and Pletal.  X____Stop Anti-inflammatories such as Advil, Aleve, Ibuprofen, Motrin, Naproxen, Naprosyn, Goodies powders or aspirin products NOW- OK to take Tylenol    _x___ Stop supplements until after surgery-STOP BIOTIN NOW-MAY RESUME AFTER SURGERY   ____ Bring C-Pap to the hospital.

## 2018-10-26 ENCOUNTER — Other Ambulatory Visit: Payer: Self-pay

## 2018-10-26 ENCOUNTER — Other Ambulatory Visit
Admission: RE | Admit: 2018-10-26 | Discharge: 2018-10-26 | Disposition: A | Discharge status: Home / Self Care | Payer: 59 | Source: Ambulatory Visit | Attending: Obstetrics and Gynecology | Admitting: Obstetrics and Gynecology

## 2018-10-26 DIAGNOSIS — Z1159 Encounter for screening for other viral diseases: Secondary | ICD-10-CM | POA: Diagnosis present

## 2018-10-26 LAB — CBC
HCT: 42.4 % (ref 36.0–46.0)
Hemoglobin: 13.8 g/dL (ref 12.0–15.0)
MCH: 29.1 pg (ref 26.0–34.0)
MCHC: 32.5 g/dL (ref 30.0–36.0)
MCV: 89.5 fL (ref 80.0–100.0)
Platelets: 223 10*3/uL (ref 150–400)
RBC: 4.74 MIL/uL (ref 3.87–5.11)
RDW: 12.9 % (ref 11.5–15.5)
WBC: 6.4 10*3/uL (ref 4.0–10.5)
nRBC: 0 % (ref 0.0–0.2)

## 2018-10-26 LAB — TYPE AND SCREEN
ABO/RH(D): O POS
Antibody Screen: NEGATIVE

## 2018-10-26 LAB — SARS CORONAVIRUS 2 (TAT 6-24 HRS): SARS Coronavirus 2: NEGATIVE

## 2018-10-30 ENCOUNTER — Other Ambulatory Visit: Payer: Self-pay

## 2018-10-30 ENCOUNTER — Encounter
Admission: RE | Disposition: A | Discharge status: Home / Self Care | Payer: Self-pay | Source: Home / Self Care | Attending: Obstetrics and Gynecology

## 2018-10-30 ENCOUNTER — Ambulatory Visit: Payer: 59 | Admitting: Anesthesiology

## 2018-10-30 ENCOUNTER — Encounter: Payer: Self-pay | Admitting: *Deleted

## 2018-10-30 ENCOUNTER — Ambulatory Visit
Admission: RE | Admit: 2018-10-30 | Discharge: 2018-10-30 | Disposition: A | Discharge status: Home / Self Care | Payer: 59 | Attending: Obstetrics and Gynecology | Admitting: Obstetrics and Gynecology

## 2018-10-30 DIAGNOSIS — Z87891 Personal history of nicotine dependence: Secondary | ICD-10-CM | POA: Insufficient documentation

## 2018-10-30 DIAGNOSIS — R9389 Abnormal findings on diagnostic imaging of other specified body structures: Secondary | ICD-10-CM | POA: Diagnosis not present

## 2018-10-30 DIAGNOSIS — E559 Vitamin D deficiency, unspecified: Secondary | ICD-10-CM | POA: Insufficient documentation

## 2018-10-30 DIAGNOSIS — N950 Postmenopausal bleeding: Secondary | ICD-10-CM

## 2018-10-30 DIAGNOSIS — N840 Polyp of corpus uteri: Secondary | ICD-10-CM | POA: Diagnosis not present

## 2018-10-30 DIAGNOSIS — Z880 Allergy status to penicillin: Secondary | ICD-10-CM | POA: Diagnosis not present

## 2018-10-30 HISTORY — PX: HYSTEROSCOPY WITH D & C: SHX1775

## 2018-10-30 LAB — ABO/RH: ABO/RH(D): O POS

## 2018-10-30 LAB — GLUCOSE, CAPILLARY: Glucose-Capillary: 87 mg/dL (ref 70–99)

## 2018-10-30 SURGERY — DILATATION AND CURETTAGE /HYSTEROSCOPY
Anesthesia: General

## 2018-10-30 MED ORDER — MIDAZOLAM HCL 2 MG/2ML IJ SOLN
INTRAMUSCULAR | Status: DC | PRN
  Administered 2018-10-30: 2 mg via INTRAVENOUS

## 2018-10-30 MED ORDER — PROPOFOL 10 MG/ML IV BOLUS
INTRAVENOUS | Status: DC | PRN
  Administered 2018-10-30: 180 mg via INTRAVENOUS

## 2018-10-30 MED ORDER — ONDANSETRON HCL 4 MG/2ML IJ SOLN
4.0000 mg | Freq: Once | INTRAMUSCULAR | Status: AC | PRN
  Administered 2018-10-30: 4 mg via INTRAVENOUS

## 2018-10-30 MED ORDER — EPHEDRINE SULFATE 50 MG/ML IJ SOLN
INTRAMUSCULAR | Status: DC | PRN
  Administered 2018-10-30 (×2): 10 mg via INTRAVENOUS

## 2018-10-30 MED ORDER — MIDAZOLAM HCL 2 MG/2ML IJ SOLN
INTRAMUSCULAR | Status: AC
  Filled 2018-10-30: qty 2

## 2018-10-30 MED ORDER — IBUPROFEN 800 MG PO TABS
800.0000 mg | ORAL_TABLET | Freq: Once | ORAL | Status: AC
  Administered 2018-10-30: 800 mg via ORAL
  Filled 2018-10-30: qty 1

## 2018-10-30 MED ORDER — FENTANYL CITRATE (PF) 100 MCG/2ML IJ SOLN
INTRAMUSCULAR | Status: AC
  Filled 2018-10-30: qty 2

## 2018-10-30 MED ORDER — IBUPROFEN 800 MG PO TABS: 800.0000 mg | ORAL_TABLET | Freq: Three times a day (TID) | ORAL | 1 refills | Status: DC | PRN

## 2018-10-30 MED ORDER — PROPOFOL 10 MG/ML IV BOLUS
INTRAVENOUS | Status: AC
  Filled 2018-10-30: qty 20

## 2018-10-30 MED ORDER — LACTATED RINGERS IV SOLN
INTRAVENOUS | Status: DC
  Administered 2018-10-30 (×2): via INTRAVENOUS

## 2018-10-30 MED ORDER — ONDANSETRON HCL 4 MG/2ML IJ SOLN
INTRAMUSCULAR | Status: AC
  Filled 2018-10-30: qty 2

## 2018-10-30 MED ORDER — FENTANYL CITRATE (PF) 100 MCG/2ML IJ SOLN
25.0000 ug | INTRAMUSCULAR | Status: DC | PRN
  Administered 2018-10-30 (×4): 25 ug via INTRAVENOUS

## 2018-10-30 MED ORDER — LACTATED RINGERS IV SOLN: INTRAVENOUS | Status: DC

## 2018-10-30 MED ORDER — IBUPROFEN 800 MG PO TABS
ORAL_TABLET | ORAL | Status: AC
  Filled 2018-10-30: qty 1

## 2018-10-30 MED ORDER — FAMOTIDINE 20 MG PO TABS: 20.0000 mg | ORAL_TABLET | Freq: Once | ORAL | Status: DC

## 2018-10-30 MED ORDER — LIDOCAINE HCL (CARDIAC) PF 100 MG/5ML IV SOSY
PREFILLED_SYRINGE | INTRAVENOUS | Status: DC | PRN
  Administered 2018-10-30: 60 mg via INTRAVENOUS

## 2018-10-30 MED ORDER — FENTANYL CITRATE (PF) 100 MCG/2ML IJ SOLN
INTRAMUSCULAR | Status: AC
  Administered 2018-10-30: 14:00:00 25 ug via INTRAVENOUS
  Filled 2018-10-30: qty 2

## 2018-10-30 MED ORDER — DEXAMETHASONE SODIUM PHOSPHATE 10 MG/ML IJ SOLN
INTRAMUSCULAR | Status: DC | PRN
  Administered 2018-10-30: 10 mg via INTRAVENOUS

## 2018-10-30 MED ORDER — FENTANYL CITRATE (PF) 100 MCG/2ML IJ SOLN
INTRAMUSCULAR | Status: DC | PRN
  Administered 2018-10-30: 50 ug via INTRAVENOUS

## 2018-10-30 SURGICAL SUPPLY — 20 items
CATH ROBINSON RED A/P 16FR (CATHETERS) ×3 IMPLANT
DEVICE MYOSURE LITE (MISCELLANEOUS) IMPLANT
DEVICE MYOSURE REACH (MISCELLANEOUS) ×2 IMPLANT
ELECT REM PT RETURN 9FT ADLT (ELECTROSURGICAL)
ELECTRODE REM PT RTRN 9FT ADLT (ELECTROSURGICAL) IMPLANT
GLOVE BIOGEL PI IND STRL 6.5 (GLOVE) ×2 IMPLANT
GLOVE BIOGEL PI INDICATOR 6.5 (GLOVE) ×4
GLOVE SURG SYN 6.5 ES PF (GLOVE) ×3 IMPLANT
GLOVE SURG SYN 6.5 PF PI (GLOVE) ×1 IMPLANT
GOWN STRL REUS W/ TWL LRG LVL3 (GOWN DISPOSABLE) ×2 IMPLANT
GOWN STRL REUS W/TWL LRG LVL3 (GOWN DISPOSABLE) ×6
KIT PROCEDURE FLUENT (KITS) ×2 IMPLANT
PACK DNC HYST (MISCELLANEOUS) ×3 IMPLANT
PAD OB MATERNITY 4.3X12.25 (PERSONAL CARE ITEMS) ×3 IMPLANT
PAD PREP 24X41 OB/GYN DISP (PERSONAL CARE ITEMS) ×3 IMPLANT
SEAL ROD LENS SCOPE MYOSURE (ABLATOR) ×3 IMPLANT
SOL .9 NS 3000ML IRR  AL (IV SOLUTION) ×2
SOL .9 NS 3000ML IRR AL (IV SOLUTION) ×1
SOL .9 NS 3000ML IRR UROMATIC (IV SOLUTION) ×1 IMPLANT
TOWEL OR 17X26 4PK STRL BLUE (TOWEL DISPOSABLE) ×3 IMPLANT

## 2018-10-30 NOTE — Discharge Instructions (Signed)
Hysteroscopy, Care After This sheet gives you information about how to care for yourself after your procedure. Your health care provider may also give you more specific instructions. If you have problems or questions, contact your health care provider. What can I expect after the procedure? After the procedure, it is common to have:  Cramping.  Bleeding. This can vary from light spotting to menstrual-like bleeding. Follow these instructions at home: Activity  Rest for 1-2 days after the procedure.  Do not douche, use tampons, or have sex for 2 weeks after the procedure, or until your health care provider approves.  Do not drive for 24 hours after the procedure, or for as long as told by your health care provider.  Do not drive, use heavy machinery, or drink alcohol while taking prescription pain medicines. Medicines   Take over-the-counter and prescription medicines only as told by your health care provider.  Do not take aspirin during recovery. It can increase the risk of bleeding. General instructions  Do not take baths, swim, or use a hot tub until your health care provider approves. Take showers instead of baths for 2 weeks, or for as long as told by your health care provider.  To prevent or treat constipation while you are taking prescription pain medicine, your health care provider may recommend that you: ? Drink enough fluid to keep your urine clear or pale yellow. ? Take over-the-counter or prescription medicines. ? Eat foods that are high in fiber, such as fresh fruits and vegetables, whole grains, and beans. ? Limit foods that are high in fat and processed sugars, such as fried and sweet foods.  Keep all follow-up visits as told by your health care provider. This is important. Contact a health care provider if:  You feel dizzy or lightheaded.  You feel nauseous.  You have abnormal vaginal discharge.  You have a rash.  You have pain that does not get better with  medicine.  You have chills. Get help right away if:  You have bleeding that is heavier than a normal menstrual period.  You have a fever.  You have pain or cramps that get worse.  You develop new abdominal pain.  You faint.  You have pain in your shoulders.  You have shortness of breath. Summary  After the procedure, you may have cramping and some vaginal bleeding.  Do not douche, use tampons, or have sex for 2 weeks after the procedure, or until your health care provider approves.  Do not take baths, swim, or use a hot tub until your health care provider approves. Take showers instead of baths for 2 weeks, or for as long as told by your health care provider.  Report any unusual symptoms to your health care provider.  Keep all follow-up visits as told by your health care provider. This is important. This information is not intended to replace advice given to you by your health care provider. Make sure you discuss any questions you have with your health care provider. Document Released: 01/16/2013 Document Revised: 03/10/2017 Document Reviewed: 04/26/2016 Elsevier Patient Education  2020 [place]   1) The drugs that you were given will stay in your system until tomorrow so for the next 24 hours you should not:  A) Drive an automobile B) Make any legal decisions C) Drink any alcoholic beverage   2) You may resume regular meals tomorrow.  Today it is better to start with liquids and gradually work  up to solid foods.  You may eat anything you prefer, but it is better to start with liquids, then soup and crackers, and gradually work up to solid foods.   3) Please notify your doctor immediately if you have any unusual bleeding, trouble breathing, redness and pain at the surgery site, drainage, fever, or pain not relieved by medication.    4) Additional Instructions:        Please contact your physician with any  problems or Same Day Surgery at [phone], Monday through Friday 6 am to 4 pm, or [place] at [facility] number at [phone].

## 2018-10-30 NOTE — Interval H&P Note (Signed)
History and Physical Interval Note:  10/30/2018 10:56 AM  Betty Snyder  has presented today for surgery, with the diagnosis of POST MENOPAUSAL BLEEDING.  The various methods of treatment have been discussed with the patient and family. After consideration of risks, benefits and other options for treatment, the patient has consented to  Procedure(s): DILATATION AND CURETTAGE /HYSTEROSCOPY, POSSIBLE POLYPECTOMY (N/A) as a surgical intervention.  The patient's history has been reviewed, patient examined, no change in status, stable for surgery.  I have reviewed the patient's chart and labs.  Questions were answered to the patient's satisfaction.     [place]

## 2018-10-30 NOTE — Op Note (Signed)
Operative Note  10/30/2018  PRE-OP DIAGNOSIS: Postmenopauseal bleeding, thickened endometrium  POST-OP DIAGNOSIS: same   SURGEON: Christanna Schuman MD  PROCEDURE: Procedure(s): DILATATION AND CURETTAGE /HYSTEROSCOPY,  POLYPECTOMY   ANESTHESIA: Choice   ESTIMATED BLOOD LOSS: 5 cc   SPECIMENS:  Endometrial polyp  FLUID DEFICIT: 72QA  COMPLICATIONS: none  DISPOSITION: PACU - hemodynamically stable.  CONDITION: stable  FINDINGS: Exam under anesthesia revealed  bulky enlarged fibroid uterus with bilateral adnexa  masses or fullness. Hysteroscopy revealed 9.5 uterine cavity with an endometrial polyp with bilateral tubal ostia and normal appearing endocervical canal.  PROCEDURE IN DETAIL: After informed consent was obtained, the patient was taken to the operating room where anesthesia was obtained without difficulty. The patient was positioned in the dorsal lithotomy position in Rule. The patient's bladder was catheterized with an in and out foley catheter. The patient was examined under anesthesia, with the above noted findings. The weighted speculum was placed inside the patient's vagina, and the the anterior lip of the cervix was seen and grasped with the tenaculum.  The uterine cavity was sounded to 9.5 cm, and then the cervix was progressively dilated to a 16 French-Pratt dilator. The 0 degree hysteroscope was introduced, with saline fluid used to distend the intrauterine cavity, with the above noted findings.  The Myosure was used to remove the uterine polyp. The polyp was entirely removed. The  hysteroscope was removed.   The uterine cavity was curetted until a gritty texture was noted, yielding endometrial curettings. Excellent hemostasis was noted, and all instruments were removed, with excellent hemostasis noted throughout. She was then taken out of dorsal lithotomy. Minimal discrepancy in fluid was noted.  The patient tolerated the procedure well. Sponge, lap and  needle counts were correct x2. The patient was taken to recovery room in excellent condition.  Adrian Prows MD [facility], [facility] 10/30/2018 2:05 PM

## 2018-10-30 NOTE — Anesthesia Preprocedure Evaluation (Signed)
Anesthesia Evaluation  Patient identified by MRN, date of birth, ID band Patient awake    Reviewed: Allergy & Precautions, H&P , NPO status , Patient's Chart, lab work & pertinent test results, reviewed documented beta blocker date and time   Airway Mallampati: II  TM Distance: >3 FB Neck ROM: full    Dental  (+) Teeth Intact   Pulmonary neg pulmonary ROS, former smoker,    Pulmonary exam normal        Cardiovascular Exercise Tolerance: Good negative cardio ROS Normal cardiovascular exam Rate:Normal     Neuro/Psych  Headaches, PSYCHIATRIC DISORDERS Anxiety Depression  Neuromuscular disease    GI/Hepatic negative GI ROS, Neg liver ROS,   Endo/Other  negative endocrine ROS  Renal/GU negative Renal ROS  negative genitourinary   Musculoskeletal   Abdominal   Peds  Hematology negative hematology ROS (+)   Anesthesia Other Findings   Reproductive/Obstetrics negative OB ROS                             Anesthesia Physical Anesthesia Plan  ASA: II  Anesthesia Plan: General LMA   Post-op Pain Management:    Induction:   PONV Risk Score and Plan:   Airway Management Planned:   Additional Equipment:   Intra-op Plan:   Post-operative Plan:   Informed Consent: I have reviewed the patients History and Physical, chart, labs and discussed the procedure including the risks, benefits and alternatives for the proposed anesthesia with the patient or authorized representative who has indicated his/her understanding and acceptance.       Plan Discussed with: CRNA  Anesthesia Plan Comments:         Anesthesia Quick Evaluation

## 2018-10-30 NOTE — Anesthesia Post-op Follow-up Note (Signed)
Anesthesia QCDR form completed.        

## 2018-10-30 NOTE — Transfer of Care (Signed)
Immediate Anesthesia Transfer of Care Note  Patient: Betty Snyder  Procedure(s) Performed: DILATATION AND CURETTAGE /HYSTEROSCOPY,  POLYPECTOMY (N/A )  Patient Location: PACU  Anesthesia Type:General  Level of Consciousness: awake  Airway & Oxygen Therapy: Patient Spontanous Breathing and Patient connected to face mask oxygen  Post-op Assessment: Report given to RN and Post -op Vital signs reviewed and stable  Post vital signs: Reviewed and stable  Last Vitals:  Vitals Value Taken Time  BP    Temp    Pulse 71 10/30/18 1359  Resp    SpO2 100 % 10/30/18 1359  Vitals shown include unvalidated device data.  Last Pain:  Vitals:   10/30/18 1044  TempSrc: Oral  PainSc: 0-No pain         Complications: No apparent anesthesia complications

## 2018-11-01 LAB — SURGICAL PATHOLOGY

## 2018-11-01 NOTE — Progress Notes (Signed)
Released to mychart with note

## 2018-11-06 ENCOUNTER — Other Ambulatory Visit: Payer: Self-pay

## 2018-11-06 ENCOUNTER — Ambulatory Visit (INDEPENDENT_AMBULATORY_CARE_PROVIDER_SITE_OTHER): Payer: 59 | Admitting: Family Medicine

## 2018-11-06 ENCOUNTER — Encounter (INDEPENDENT_AMBULATORY_CARE_PROVIDER_SITE_OTHER): Payer: Self-pay | Admitting: Family Medicine

## 2018-11-06 VITALS — BP 99/66 | HR 61 | Temp 99.1°F | Ht 62.0 in | Wt 182.0 lb

## 2018-11-06 DIAGNOSIS — E559 Vitamin D deficiency, unspecified: Secondary | ICD-10-CM | POA: Diagnosis not present

## 2018-11-06 DIAGNOSIS — Z6833 Body mass index (BMI) 33.0-33.9, adult: Secondary | ICD-10-CM

## 2018-11-06 DIAGNOSIS — Z9189 Other specified personal risk factors, not elsewhere classified: Secondary | ICD-10-CM

## 2018-11-06 DIAGNOSIS — F418 Other specified anxiety disorders: Secondary | ICD-10-CM | POA: Diagnosis not present

## 2018-11-06 DIAGNOSIS — E669 Obesity, unspecified: Secondary | ICD-10-CM

## 2018-11-06 MED ORDER — ESCITALOPRAM OXALATE 10 MG PO TABS: 10.0000 mg | ORAL_TABLET | Freq: Every evening | ORAL | 0 refills | Status: DC

## 2018-11-06 MED ORDER — VITAMIN D (ERGOCALCIFEROL) 1.25 MG (50000 UNIT) PO CAPS: 50000.0000 [IU] | ORAL_CAPSULE | ORAL | 0 refills | Status: DC
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
## 2018-11-08 ENCOUNTER — Ambulatory Visit (INDEPENDENT_AMBULATORY_CARE_PROVIDER_SITE_OTHER): Payer: 59 | Admitting: Obstetrics and Gynecology

## 2018-11-08 ENCOUNTER — Other Ambulatory Visit: Payer: Self-pay

## 2018-11-08 ENCOUNTER — Encounter: Payer: Self-pay | Admitting: Obstetrics and Gynecology

## 2018-11-08 VITALS — BP 112/60 | HR 65 | Ht 62.0 in | Wt 185.0 lb

## 2018-11-08 DIAGNOSIS — N950 Postmenopausal bleeding: Secondary | ICD-10-CM

## 2018-11-08 DIAGNOSIS — D252 Subserosal leiomyoma of uterus: Secondary | ICD-10-CM

## 2018-11-08 NOTE — Progress Notes (Signed)
  Postoperative Follow-up Patient presents post op from operative hysteroscopy for postmenopausal bleedin, 1 week ago.  Subjective: Patient reports marked improvement in her preop symptoms. Eating a regular diet without difficulty. Pain is controlled without any medications.  Activity: normal activities of daily living. Patient reports additional symptom's since surgery of None.  Objective: BP 112/60   Pulse 65   Ht 5\' 2"  (1.575 m)   Wt 185 lb (83.9 kg)   LMP 10/22/2013   BMI 33.84 kg/m  Physical Exam Constitutional:      Appearance: She is well-developed.  Genitourinary:     Vagina and uterus normal.     No lesions in the vagina.     No cervical motion tenderness.     No right or left adnexal mass present.  HENT:     Head: Normocephalic and atraumatic.  Neck:     Musculoskeletal: Neck supple.     Thyroid: No thyromegaly.  Cardiovascular:     Rate and Rhythm: Normal rate and regular rhythm.     Heart sounds: Normal heart sounds.  Pulmonary:     Effort: Pulmonary effort is normal.     Breath sounds: Normal breath sounds.  Chest:     Breasts:        Right: No inverted nipple, mass, nipple discharge or skin change.        Left: No inverted nipple, mass, nipple discharge or skin change.  Abdominal:     General: Bowel sounds are normal. There is no distension.     Palpations: Abdomen is soft. There is no mass.  Neurological:     Mental Status: She is alert and oriented to person, place, and time.  Skin:    General: Skin is warm and dry.  Psychiatric:        Behavior: Behavior normal.        Thought Content: Thought content normal.        Judgment: Judgment normal.  Vitals signs reviewed.    Assessment: s/p :  operative hysteroscopy stable  Plan: Patient has done well after surgery with no apparent complications.  I have discussed the post-operative course to date, and the expected progress moving forward.  The patient understands what complications to be concerned  about.  I will see the patient in routine follow up, or sooner if needed.    Feeling well- discussed laparoscopic myomectomy versus hysterectomy. Patient understands risks of morcellation etc. She will schedule surgery.   Activity plan: No restriction.  Pelvic rest.  Ayman Brull R Corney Knighton 11/08/2018, 3:37 PM

## 2018-11-13 ENCOUNTER — Encounter: Payer: Self-pay | Admitting: Family Medicine

## 2018-11-13 ENCOUNTER — Encounter (INDEPENDENT_AMBULATORY_CARE_PROVIDER_SITE_OTHER): Payer: Self-pay | Admitting: Family Medicine

## 2018-11-14 ENCOUNTER — Other Ambulatory Visit (INDEPENDENT_AMBULATORY_CARE_PROVIDER_SITE_OTHER): Payer: Self-pay | Admitting: Family Medicine

## 2018-11-14 ENCOUNTER — Telehealth (INDEPENDENT_AMBULATORY_CARE_PROVIDER_SITE_OTHER): Payer: Self-pay | Admitting: Family Medicine

## 2018-11-14 DIAGNOSIS — F418 Other specified anxiety disorders: Secondary | ICD-10-CM

## 2018-11-14 DIAGNOSIS — E559 Vitamin D deficiency, unspecified: Secondary | ICD-10-CM

## 2018-11-14 NOTE — Telephone Encounter (Signed)
Called pharmacy and they do have them on file.  They will get them ready and call the patient.

## 2018-11-14 NOTE — Telephone Encounter (Signed)
Pt states [facility] didn't receive her refill request for Vit D and she thinks the other is Lipitor.  Pt states she is out of both meds.  Thank you

## 2018-11-16 ENCOUNTER — Telehealth: Payer: Self-pay | Admitting: Obstetrics and Gynecology

## 2018-11-16 NOTE — Telephone Encounter (Signed)
Patient is aware of H&P on 11/28/18 @ 3:10pm w/ Dr. Gilman Schmidt, Pre-admit testing to be scheduled (patient will watch for notification on MyChart), Covid testing on 11/30/18 @ 8-10:30am, and OR on 12/04/18. Patient is aware she will be asked to quarantine after Covid testing. Patient is aware she may receive calls from the [facility] and [facility]. Patient confirmed [employer] and no secondary insurance.

## 2018-11-16 NOTE — Telephone Encounter (Signed)
-----   Message from Homero Fellers, MD sent at 11/15/2018  9:29 AM EDT ----- This is the correct one ----- Message ----- From: [place] Lodge Sent: 11/14/2018  10:25 AM EDT To: Homero Fellers, MD  You already sent a request for this patient, but for a different surgery. Which one is correct? I cannot schedule until back in the office on Friday. ----- Message ----- From: Homero Fellers, MD Sent: 11/13/2018   6:11 PM EDT To: [place] Lodge  Surgery Booking Request Patient Full Name:  Betty Snyder  MRN: [id number]  DOB: 10/16/1965  Surgeon: Homero Fellers, MD  Requested Surgery Date and Time: up to patient Primary Diagnosis AND Code: Uterine fibroid D25.9 Secondary Diagnosis and Code:  Surgical Procedure: TLH/BS L&D Notification: No Admission Status: same day surgery Length of Surgery: 2 hours Special Case Needs: none H&P: TBS (date) Phone Interview???: yes Interpreter: Language:  Medical Clearance: no Special Scheduling Instructions: none Acuity: P3

## 2018-11-20 NOTE — Anesthesia Postprocedure Evaluation (Signed)
Anesthesia Post Note  Patient: Betty Snyder  Procedure(s) Performed: DILATATION AND CURETTAGE /HYSTEROSCOPY,  POLYPECTOMY (N/A )  Patient location during evaluation: PACU Anesthesia Type: General Level of consciousness: awake and alert Pain management: pain level controlled Vital Signs Assessment: post-procedure vital signs reviewed and stable Respiratory status: spontaneous breathing, nonlabored ventilation, respiratory function stable and patient connected to nasal cannula oxygen Cardiovascular status: blood pressure returned to baseline and stable Postop Assessment: no apparent nausea or vomiting Anesthetic complications: no     Last Vitals:  Vitals:   10/30/18 1452 10/30/18 1600  BP: 103/63 (!) 141/71  Pulse: 60 (!) 59  Resp: 16 16  Temp: 36.4 C 36.9 C  SpO2: 93% 95%    Last Pain:  Vitals:   10/30/18 1600  TempSrc: Temporal  PainSc: 3                  Molli Barrows

## 2018-11-27 ENCOUNTER — Encounter (INDEPENDENT_AMBULATORY_CARE_PROVIDER_SITE_OTHER): Payer: Self-pay | Admitting: Family Medicine

## 2018-11-27 ENCOUNTER — Ambulatory Visit (INDEPENDENT_AMBULATORY_CARE_PROVIDER_SITE_OTHER): Payer: 59 | Admitting: Family Medicine

## 2018-11-27 ENCOUNTER — Other Ambulatory Visit: Payer: Self-pay

## 2018-11-27 VITALS — BP 105/69 | HR 76 | Temp 98.4°F | Ht 62.0 in | Wt 184.0 lb

## 2018-11-27 DIAGNOSIS — E559 Vitamin D deficiency, unspecified: Secondary | ICD-10-CM | POA: Diagnosis not present

## 2018-11-27 DIAGNOSIS — Z6833 Body mass index (BMI) 33.0-33.9, adult: Secondary | ICD-10-CM

## 2018-11-27 DIAGNOSIS — E7849 Other hyperlipidemia: Secondary | ICD-10-CM | POA: Diagnosis not present

## 2018-11-27 DIAGNOSIS — E66811 Obesity, class 1: Secondary | ICD-10-CM

## 2018-11-27 DIAGNOSIS — E669 Obesity, unspecified: Secondary | ICD-10-CM

## 2018-11-27 DIAGNOSIS — Z9189 Other specified personal risk factors, not elsewhere classified: Secondary | ICD-10-CM

## 2018-11-27 MED ORDER — VITAMIN D (ERGOCALCIFEROL) 1.25 MG (50000 UNIT) PO CAPS: 50000.0000 [IU] | ORAL_CAPSULE | ORAL | 0 refills | Status: DC

## 2018-11-28 ENCOUNTER — Encounter: Payer: Self-pay | Admitting: Obstetrics and Gynecology

## 2018-11-28 ENCOUNTER — Ambulatory Visit (INDEPENDENT_AMBULATORY_CARE_PROVIDER_SITE_OTHER): Payer: 59 | Admitting: Obstetrics and Gynecology

## 2018-11-28 VITALS — BP 130/86 | HR 73 | Ht 61.5 in | Wt 188.0 lb

## 2018-11-28 DIAGNOSIS — N950 Postmenopausal bleeding: Secondary | ICD-10-CM

## 2018-11-28 DIAGNOSIS — D252 Subserosal leiomyoma of uterus: Secondary | ICD-10-CM
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
## 2018-11-29 ENCOUNTER — Encounter
Admission: RE | Admit: 2018-11-29 | Discharge: 2018-11-29 | Disposition: A | Discharge status: Home / Self Care | Payer: 59 | Source: Ambulatory Visit | Attending: Obstetrics and Gynecology | Admitting: Obstetrics and Gynecology

## 2018-11-29 ENCOUNTER — Other Ambulatory Visit: Payer: Self-pay

## 2018-11-29 NOTE — Patient Instructions (Signed)
Your procedure is scheduled on: 12/04/18 Report to Day Surgery.[facility]OND FLOOR To find out your arrival time please call [phone] between 1PM - 3PM on 12/03/18.  Remember: Instructions that are not followed completely may result in serious medical risk,  up to and including death, or upon the discretion of your surgeon and anesthesiologist your  surgery may need to be rescheduled.     _X__ 1. Do not eat food after midnight the night before your procedure.                 No gum chewing or hard candies. You may drink clear liquids up to 2 hours                 before you are scheduled to arrive for your surgery- DO not drink clear                 liquids within 2 hours of the start of your surgery.                 Clear Liquids include:  water, apple juice without pulp, clear carbohydrate                 drink such as Clearfast of Gatorade, Black Coffee or Tea (Do not add                 anything to coffee or tea).  __X__2.  On the morning of surgery brush your teeth with toothpaste and water, you                may rinse your mouth with mouthwash if you wish.  Do not swallow any toothpaste of mouthwash.     _X__ 3.  No Alcohol for 24 hours before or after surgery.   _X__ 4.  Do Not Smoke or use e-cigarettes For 24 Hours Prior to Your Surgery.                 Do not use any chewable tobacco products for at least 6 hours prior to                 surgery.  ____  5.  Bring all medications with you on the day of surgery if instructed.   _X___  6.  Notify your doctor if there is any change in your medical condition      (cold, fever, infections).     Do not wear jewelry, make-up, hairpins, clips or nail polish. Do not wear lotions, powders, or perfumes. You may wear deodorant. Do not shave 48 hours prior to surgery. Men may shave face and neck. Do not bring valuables to the hospital.    [facility] is not responsible for any belongings or  valuables.  Contacts, dentures or bridgework may not be worn into surgery. Leave your suitcase in the car. After surgery it may be brought to your room. For patients admitted to the hospital, discharge time is determined by your treatment team.   Patients discharged the day of surgery will not be allowed to drive home.   Please read over the following fact sheets that you were given:   Surgical Site Infection Prevention          ____ Take these medicines the morning of surgery with A SIP OF WATER:    1. NONE  2.   3.   4.  5.  6.  ____ Fleet Enema (as directed)   __X__  Use CHG Soap as directed  ____ Use inhalers on the day of surgery  ____ Stop metformin 2 days prior to surgery    ____ Take 1/2 of usual insulin dose the night before surgery. No insulin the morning          of surgery.   ____ Stop Coumadin/Plavix/aspirin on   ____ Stop Anti-inflammatories on    ____ Stop supplements until after surgery.    ____ Bring C-Pap to the hospital.

## 2018-11-30 ENCOUNTER — Other Ambulatory Visit
Admission: RE | Admit: 2018-11-30 | Discharge: 2018-11-30 | Disposition: A | Discharge status: Home / Self Care | Payer: 59 | Source: Ambulatory Visit | Attending: Obstetrics and Gynecology | Admitting: Obstetrics and Gynecology

## 2018-11-30 DIAGNOSIS — Z01812 Encounter for preprocedural laboratory examination: Secondary | ICD-10-CM | POA: Insufficient documentation

## 2018-11-30 DIAGNOSIS — Z20828 Contact with and (suspected) exposure to other viral communicable diseases: Secondary | ICD-10-CM | POA: Diagnosis not present

## 2018-11-30 LAB — CBC
HCT: 43.5 % (ref 36.0–46.0)
Hemoglobin: 13.8 g/dL (ref 12.0–15.0)
MCH: 29.1 pg (ref 26.0–34.0)
MCHC: 31.7 g/dL (ref 30.0–36.0)
MCV: 91.8 fL (ref 80.0–100.0)
Platelets: 218 10*3/uL (ref 150–400)
RBC: 4.74 MIL/uL (ref 3.87–5.11)
RDW: 12.7 % (ref 11.5–15.5)
WBC: 6.6 10*3/uL (ref 4.0–10.5)
nRBC: 0 % (ref 0.0–0.2)

## 2018-11-30 LAB — SARS CORONAVIRUS 2 (TAT 6-24 HRS): SARS Coronavirus 2: NEGATIVE

## 2018-11-30 LAB — TYPE AND SCREEN
ABO/RH(D): O POS
Antibody Screen: NEGATIVE

## 2018-11-30 NOTE — Progress Notes (Signed)
WNL

## 2018-12-04 ENCOUNTER — Encounter
Admission: RE | Disposition: A | Discharge status: Home / Self Care | Payer: Self-pay | Source: Home / Self Care | Attending: Obstetrics and Gynecology

## 2018-12-04 ENCOUNTER — Ambulatory Visit: Payer: 59 | Admitting: Certified Registered"

## 2018-12-04 ENCOUNTER — Other Ambulatory Visit: Payer: Self-pay

## 2018-12-04 ENCOUNTER — Encounter: Payer: Self-pay | Admitting: *Deleted

## 2018-12-04 ENCOUNTER — Ambulatory Visit
Admission: RE | Admit: 2018-12-04 | Discharge: 2018-12-04 | Disposition: A | Discharge status: Home / Self Care | Payer: 59 | Attending: Obstetrics and Gynecology | Admitting: Obstetrics and Gynecology

## 2018-12-04 DIAGNOSIS — Z87891 Personal history of nicotine dependence: Secondary | ICD-10-CM | POA: Insufficient documentation

## 2018-12-04 DIAGNOSIS — D270 Benign neoplasm of right ovary: Secondary | ICD-10-CM | POA: Diagnosis not present

## 2018-12-04 DIAGNOSIS — F329 Major depressive disorder, single episode, unspecified: Secondary | ICD-10-CM | POA: Insufficient documentation

## 2018-12-04 DIAGNOSIS — D252 Subserosal leiomyoma of uterus: Secondary | ICD-10-CM

## 2018-12-04 DIAGNOSIS — F419 Anxiety disorder, unspecified: Secondary | ICD-10-CM | POA: Insufficient documentation

## 2018-12-04 DIAGNOSIS — D251 Intramural leiomyoma of uterus: Secondary | ICD-10-CM | POA: Diagnosis not present

## 2018-12-04 DIAGNOSIS — D259 Leiomyoma of uterus, unspecified: Secondary | ICD-10-CM | POA: Diagnosis present

## 2018-12-04 DIAGNOSIS — E559 Vitamin D deficiency, unspecified: Secondary | ICD-10-CM | POA: Diagnosis not present

## 2018-12-04 HISTORY — PX: CYSTOSCOPY: SHX5120

## 2018-12-04 HISTORY — PX: TOTAL LAPAROSCOPIC HYSTERECTOMY WITH SALPINGECTOMY: SHX6742

## 2018-12-04 SURGERY — HYSTERECTOMY, TOTAL, LAPAROSCOPIC, WITH SALPINGECTOMY
Anesthesia: General | Laterality: Right

## 2018-12-04 MED ORDER — CLINDAMYCIN PHOSPHATE 900 MG/50ML IV SOLN
INTRAVENOUS | Status: AC
  Filled 2018-12-04: qty 50

## 2018-12-04 MED ORDER — IBUPROFEN 800 MG PO TABS: 800.0000 mg | ORAL_TABLET | Freq: Three times a day (TID) | ORAL | 1 refills | Status: DC | PRN

## 2018-12-04 MED ORDER — GENTAMICIN SULFATE 40 MG/ML IJ SOLN
5.0000 mg/kg | INTRAVENOUS | Status: DC
  Filled 2018-12-04: qty 10.75

## 2018-12-04 MED ORDER — ACETAMINOPHEN 500 MG PO TABS: 1000.0000 mg | ORAL_TABLET | Freq: Four times a day (QID) | ORAL | 1 refills | Status: DC | PRN

## 2018-12-04 MED ORDER — FLUORESCEIN SODIUM 10 % IV SOLN
INTRAVENOUS | Status: AC
  Filled 2018-12-04: qty 5

## 2018-12-04 MED ORDER — EPHEDRINE SULFATE 50 MG/ML IJ SOLN
INTRAMUSCULAR | Status: DC | PRN
  Administered 2018-12-04 (×2): 5 mg via INTRAVENOUS
  Administered 2018-12-04 (×4): 10 mg via INTRAVENOUS

## 2018-12-04 MED ORDER — DEXAMETHASONE SODIUM PHOSPHATE 10 MG/ML IJ SOLN
INTRAMUSCULAR | Status: AC
  Filled 2018-12-04: qty 1

## 2018-12-04 MED ORDER — MIDAZOLAM HCL 2 MG/2ML IJ SOLN
INTRAMUSCULAR | Status: AC
  Filled 2018-12-04: qty 2

## 2018-12-04 MED ORDER — OXYCODONE HCL 5 MG/5ML PO SOLN
ORAL | Status: AC
  Filled 2018-12-04: qty 5

## 2018-12-04 MED ORDER — LACTATED RINGERS IV SOLN
INTRAVENOUS | Status: DC
  Administered 2018-12-04 (×2): via INTRAVENOUS

## 2018-12-04 MED ORDER — LIDOCAINE HCL (PF) 2 % IJ SOLN
INTRAMUSCULAR | Status: AC
  Filled 2018-12-04: qty 10

## 2018-12-04 MED ORDER — PHENYLEPHRINE HCL (PRESSORS) 10 MG/ML IV SOLN
INTRAVENOUS | Status: DC | PRN
  Administered 2018-12-04 (×3): 200 ug via INTRAVENOUS

## 2018-12-04 MED ORDER — OXYCODONE HCL 5 MG PO TABS: 5.0000 mg | ORAL_TABLET | Freq: Once | ORAL | Status: AC | PRN

## 2018-12-04 MED ORDER — OXYCODONE HCL 5 MG PO TABS: 5.0000 mg | ORAL_TABLET | Freq: Three times a day (TID) | ORAL | 0 refills | Status: DC | PRN

## 2018-12-04 MED ORDER — FENTANYL CITRATE (PF) 100 MCG/2ML IJ SOLN
25.0000 ug | INTRAMUSCULAR | Status: DC | PRN
  Administered 2018-12-04 (×3): 25 ug via INTRAVENOUS

## 2018-12-04 MED ORDER — PROMETHAZINE HCL 25 MG/ML IJ SOLN
INTRAMUSCULAR | Status: AC
  Administered 2018-12-04: 13:00:00 6.25 mg via INTRAVENOUS
  Filled 2018-12-04: qty 1

## 2018-12-04 MED ORDER — ONDANSETRON HCL 4 MG/2ML IJ SOLN
INTRAMUSCULAR | Status: AC
  Filled 2018-12-04: qty 2

## 2018-12-04 MED ORDER — DOCUSATE SODIUM 100 MG PO CAPS: 100.0000 mg | ORAL_CAPSULE | Freq: Two times a day (BID) | ORAL | 1 refills | Status: DC

## 2018-12-04 MED ORDER — TRAMADOL HCL 50 MG PO TABS: 50.0000 mg | ORAL_TABLET | Freq: Four times a day (QID) | ORAL | 0 refills | Status: DC | PRN

## 2018-12-04 MED ORDER — ACETAMINOPHEN NICU IV SYRINGE 10 MG/ML
INTRAVENOUS | Status: AC
  Filled 2018-12-04: qty 1

## 2018-12-04 MED ORDER — SILVER NITRATE-POT NITRATE 75-25 % EX MISC
CUTANEOUS | Status: AC
  Filled 2018-12-04: qty 1

## 2018-12-04 MED ORDER — SUGAMMADEX SODIUM 200 MG/2ML IV SOLN
INTRAVENOUS | Status: DC | PRN
  Administered 2018-12-04: 200 mg via INTRAVENOUS

## 2018-12-04 MED ORDER — ACETAMINOPHEN 10 MG/ML IV SOLN
INTRAVENOUS | Status: DC | PRN
  Administered 2018-12-04: 1000 mg via INTRAVENOUS

## 2018-12-04 MED ORDER — PROMETHAZINE HCL 25 MG/ML IJ SOLN
6.2500 mg | INTRAMUSCULAR | Status: DC | PRN
  Administered 2018-12-04: 13:00:00 6.25 mg via INTRAVENOUS

## 2018-12-04 MED ORDER — EPHEDRINE SULFATE 50 MG/ML IJ SOLN
INTRAMUSCULAR | Status: AC
  Filled 2018-12-04: qty 1

## 2018-12-04 MED ORDER — CLINDAMYCIN PHOSPHATE 900 MG/50ML IV SOLN
900.0000 mg | INTRAVENOUS | Status: AC
  Administered 2018-12-04: 900 mg via INTRAVENOUS

## 2018-12-04 MED ORDER — OXYCODONE HCL 5 MG/5ML PO SOLN
5.0000 mg | Freq: Once | ORAL | Status: AC | PRN
  Administered 2018-12-04: 5 mg via ORAL

## 2018-12-04 MED ORDER — FENTANYL CITRATE (PF) 100 MCG/2ML IJ SOLN
INTRAMUSCULAR | Status: DC | PRN
  Administered 2018-12-04 (×2): 50 ug via INTRAVENOUS

## 2018-12-04 MED ORDER — LACTATED RINGERS IV SOLN: INTRAVENOUS | Status: DC

## 2018-12-04 MED ORDER — BUPIVACAINE HCL (PF) 0.5 % IJ SOLN
INTRAMUSCULAR | Status: AC
  Filled 2018-12-04: qty 30

## 2018-12-04 MED ORDER — BUPIVACAINE HCL (PF) 0.5 % IJ SOLN
INTRAMUSCULAR | Status: DC | PRN
  Administered 2018-12-04: 15 mL

## 2018-12-04 MED ORDER — DEXAMETHASONE SODIUM PHOSPHATE 10 MG/ML IJ SOLN
INTRAMUSCULAR | Status: DC | PRN
  Administered 2018-12-04: 10 mg via INTRAVENOUS

## 2018-12-04 MED ORDER — ROCURONIUM BROMIDE 100 MG/10ML IV SOLN
INTRAVENOUS | Status: DC | PRN
  Administered 2018-12-04: 20 mg via INTRAVENOUS
  Administered 2018-12-04: 50 mg via INTRAVENOUS
  Administered 2018-12-04: 20 mg via INTRAVENOUS

## 2018-12-04 MED ORDER — PROPOFOL 10 MG/ML IV BOLUS
INTRAVENOUS | Status: AC
  Filled 2018-12-04: qty 20

## 2018-12-04 MED ORDER — GENTAMICIN SULFATE 40 MG/ML IJ SOLN
5.0000 mg/kg | INTRAVENOUS | Status: AC
  Administered 2018-12-04: 08:00:00 320 mg via INTRAVENOUS
  Filled 2018-12-04: qty 8

## 2018-12-04 MED ORDER — KETOROLAC TROMETHAMINE 30 MG/ML IJ SOLN
INTRAMUSCULAR | Status: AC
  Filled 2018-12-04: qty 1

## 2018-12-04 MED ORDER — ROCURONIUM BROMIDE 50 MG/5ML IV SOLN
INTRAVENOUS | Status: AC
  Filled 2018-12-04: qty 1

## 2018-12-04 MED ORDER — KETOROLAC TROMETHAMINE 30 MG/ML IJ SOLN
30.0000 mg | Freq: Once | INTRAMUSCULAR | Status: AC
  Administered 2018-12-04: 13:00:00 30 mg via INTRAVENOUS

## 2018-12-04 MED ORDER — FAMOTIDINE 20 MG PO TABS
ORAL_TABLET | ORAL | Status: AC
  Filled 2018-12-04: qty 1

## 2018-12-04 MED ORDER — FAMOTIDINE 20 MG PO TABS
20.0000 mg | ORAL_TABLET | Freq: Once | ORAL | Status: AC
  Administered 2018-12-04: 07:00:00 20 mg via ORAL

## 2018-12-04 MED ORDER — SODIUM CHLORIDE FLUSH 0.9 % IV SOLN
INTRAVENOUS | Status: AC
  Filled 2018-12-04: qty 10

## 2018-12-04 MED ORDER — FENTANYL CITRATE (PF) 100 MCG/2ML IJ SOLN
INTRAMUSCULAR | Status: AC
  Filled 2018-12-04: qty 2

## 2018-12-04 MED ORDER — LIDOCAINE HCL (CARDIAC) PF 100 MG/5ML IV SOSY
PREFILLED_SYRINGE | INTRAVENOUS | Status: DC | PRN
  Administered 2018-12-04: 100 mg via INTRAVENOUS

## 2018-12-04 MED ORDER — ONDANSETRON HCL 4 MG/2ML IJ SOLN
INTRAMUSCULAR | Status: DC | PRN
  Administered 2018-12-04: 4 mg via INTRAVENOUS

## 2018-12-04 MED ORDER — CLINDAMYCIN PHOSPHATE 900 MG/50ML IV SOLN: 900.0000 mg | INTRAVENOUS | Status: DC

## 2018-12-04 MED ORDER — SUCCINYLCHOLINE CHLORIDE 20 MG/ML IJ SOLN
INTRAMUSCULAR | Status: AC
  Filled 2018-12-04: qty 1

## 2018-12-04 MED ORDER — PROPOFOL 10 MG/ML IV BOLUS
INTRAVENOUS | Status: DC | PRN
  Administered 2018-12-04: 140 mg via INTRAVENOUS

## 2018-12-04 MED ORDER — FENTANYL CITRATE (PF) 100 MCG/2ML IJ SOLN
INTRAMUSCULAR | Status: AC
  Administered 2018-12-04: 13:00:00 25 ug via INTRAVENOUS
  Filled 2018-12-04: qty 2

## 2018-12-04 MED ORDER — MEPERIDINE HCL 50 MG/ML IJ SOLN: 6.2500 mg | INTRAMUSCULAR | Status: DC | PRN

## 2018-12-04 SURGICAL SUPPLY — 57 items
ADH SKN CLS APL DERMABOND .7 (GAUZE/BANDAGES/DRESSINGS) ×3
ANCHOR TIS RET SYS 1550ML (BAG) ×2 IMPLANT
ANCHOR TIS RET SYS 235ML (MISCELLANEOUS) ×2 IMPLANT
APL PRP STRL LF DISP 70% ISPRP (MISCELLANEOUS) ×3
BAG SPEC RTRVL C1550 25.4 (BAG) ×3
BAG TISS RTRVL C235 10X14 (MISCELLANEOUS) ×3
BAG URINE DRAINAGE (UROLOGICAL SUPPLIES) ×5 IMPLANT
BLADE SURG SZ11 CARB STEEL (BLADE) ×5 IMPLANT
CANISTER SUCT 1200ML W/VALVE (MISCELLANEOUS) ×5 IMPLANT
CATH FOLEY 2WAY  5CC 16FR (CATHETERS) ×2
CATH FOLEY 2WAY 5CC 16FR (CATHETERS) ×3
CATH URTH 16FR FL 2W BLN LF (CATHETERS) ×3 IMPLANT
CHLORAPREP W/TINT 26 (MISCELLANEOUS) ×5 IMPLANT
COVER LIGHT HANDLE STERIS (MISCELLANEOUS) ×2 IMPLANT
DEFOGGER SCOPE WARMER CLEARIFY (MISCELLANEOUS) ×5 IMPLANT
DERMABOND ADVANCED (GAUZE/BANDAGES/DRESSINGS) ×2
DERMABOND ADVANCED .7 DNX12 (GAUZE/BANDAGES/DRESSINGS) ×3 IMPLANT
DEVICE SUTURE ENDOST 10MM (ENDOMECHANICALS) ×2 IMPLANT
DRAPE CAMERA CLOSED 9X96 (DRAPES) ×5 IMPLANT
GLOVE BIOGEL PI IND STRL 6.5 (GLOVE) ×3 IMPLANT
GLOVE BIOGEL PI INDICATOR 6.5 (GLOVE) ×2
GLOVE SURG SYN 6.5 ES PF (GLOVE) ×10 IMPLANT
GLOVE SURG SYN 6.5 PF PI (GLOVE) ×6 IMPLANT
GOWN STRL REUS W/ TWL LRG LVL3 (GOWN DISPOSABLE) ×9 IMPLANT
GOWN STRL REUS W/TWL LRG LVL3 (GOWN DISPOSABLE) ×15
GRASPER SUT TROCAR 14GX15 (MISCELLANEOUS) IMPLANT
IRRIGATION STRYKERFLOW (MISCELLANEOUS) IMPLANT
IRRIGATOR STRYKERFLOW (MISCELLANEOUS) ×5
IV LACTATED RINGERS 1000ML (IV SOLUTION) ×10 IMPLANT
KIT PINK PAD W/HEAD ARE REST (MISCELLANEOUS) ×5
KIT PINK PAD W/HEAD ARM REST (MISCELLANEOUS) ×3 IMPLANT
KIT TURNOVER CYSTO (KITS) ×10 IMPLANT
MANIPULATOR VCARE LG CRV RETR (MISCELLANEOUS) IMPLANT
MANIPULATOR VCARE SML CRV RETR (MISCELLANEOUS) IMPLANT
MANIPULATOR VCARE STD CRV RETR (MISCELLANEOUS) ×2 IMPLANT
NEEDLE VERESS 14GA 120MM (NEEDLE) IMPLANT
NS IRRIG 500ML POUR BTL (IV SOLUTION) ×5 IMPLANT
OCCLUDER COLPOPNEUMO (BALLOONS) ×5 IMPLANT
PACK GYN LAPAROSCOPIC (MISCELLANEOUS) ×5 IMPLANT
PAD OB MATERNITY 4.3X12.25 (PERSONAL CARE ITEMS) ×5 IMPLANT
PAD PREP 24X41 OB/GYN DISP (PERSONAL CARE ITEMS) ×5 IMPLANT
SCISSORS METZENBAUM CVD 33 (INSTRUMENTS) IMPLANT
SET CYSTO W/LG BORE CLAMP LF (SET/KITS/TRAYS/PACK) ×5 IMPLANT
SET TRI-LUMEN FLTR TB AIRSEAL (TUBING) ×2 IMPLANT
SHEARS HARMONIC ACE PLUS 36CM (ENDOMECHANICALS) ×2 IMPLANT
SLEEVE ENDOPATH XCEL 5M (ENDOMECHANICALS) ×7 IMPLANT
SUT ENDO VLOC 180-0-8IN (SUTURE) ×4 IMPLANT
SUT MNCRL 4-0 (SUTURE) ×5
SUT MNCRL 4-0 27XMFL (SUTURE) ×3
SUT VIC AB 0 CT1 36 (SUTURE) ×7 IMPLANT
SUTURE MNCRL 4-0 27XMF (SUTURE) ×3 IMPLANT
SYR 10ML LL (SYRINGE) ×5 IMPLANT
SYR 50ML LL SCALE MARK (SYRINGE) ×5 IMPLANT
TROCAR ENDO BLADELESS 11MM (ENDOMECHANICALS) ×3 IMPLANT
TROCAR PORT AIRSEAL 8X100 (TROCAR) ×2 IMPLANT
TROCAR XCEL NON-BLD 5MMX100MML (ENDOMECHANICALS) ×5 IMPLANT
TUBING EVAC SMOKE HEATED PNEUM (TUBING) ×5 IMPLANT

## 2018-12-04 NOTE — Anesthesia Postprocedure Evaluation (Signed)
Anesthesia Post Note  Patient: Betty Snyder  Procedure(s) Performed: TOTAL LAPAROSCOPIC HYSTERECTOMY WITH BILATERIAL SALPINGECTOMY (Bilateral ) CYSTOSCOPY LAPAROSCOPIC OOPHORECTOMY (Right )  Patient location during evaluation: PACU Anesthesia Type: General Level of consciousness: awake and alert and oriented Pain management: pain level controlled Vital Signs Assessment: post-procedure vital signs reviewed and stable Respiratory status: spontaneous breathing, nonlabored ventilation and respiratory function stable Cardiovascular status: blood pressure returned to baseline and stable Postop Assessment: no signs of nausea or vomiting Anesthetic complications: no     Last Vitals:  Vitals:   12/04/18 1304 12/04/18 1312  BP: 109/69   Pulse: 69 72  Resp: 12 13  Temp:    SpO2: 92% 90%    Last Pain:  Vitals:   12/04/18 1312  TempSrc:   PainSc: 7                  Tyronne Blann

## 2018-12-04 NOTE — Op Note (Signed)
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  ANESTHESIA: Choice   ESTIMATED BLOOD LOSS: 25 cc  SPECIMENS:  Cervix, uterus, bilateral tubes and right  ovary.  COMPLICATIONS: None  DISPOSITION: PACU - hemodynamically stable.  CONDITION: stable  FINDINGS: Exam under anesthesia revealed mobile enlarged uterus and . Intrabdominal survey revealed a right sided ovarian fibroma. Uterus with a small subserosal fibroid. Normal left ovary. Adhesions in upper abdomen. Normal appendix.   The patient was taken to the OR where anesthesia was administed. She was prepped and draped in the normal sterile fashion in the dorsal lithotomy position in the Johnston stirrups. A time out was performed. A weighted speculum was inserted, the cervix was grasped with a single tooth tenaculum and the endometrial cavity was sounded to 8cm. The cervix was progressively dilated to a size 18 [place] with [employer] dilators. A V-Care uterine manipulator was inserted in the usual fashion without incident. Gloves were changed and attention was turned to the abdomen.   An infraumbilical transverse 35mm skin incision was made with the scalpel after local anesthesia applied to the skin.  A 70mm laparoscopic port was placed in the umbilicus under direct visualization. A pneumoperitoneum was obtained by insufflation of CO2 (opening pressure of 32mmHg) to 52mmHg. A diagnostic laparoscopy was performed yielding the previously described findings. Attention was turned to the left lower quadrant where after visualization of the inferior epigastric vessels a 39mm skin incision was made with the  scalpel. A 5 mm laparoscopic port was inserted. The same procedure was repeated in the right lower quadrant with a 5 mm trocar. The umbilical fort was extended to a 97mm port.  Attention was turned to the right ovary which was enlarged and displaced anteriorly to the uterus. I was clear that the suspected anterior fibroid was actually the ovary which was enlarged and grossly abnormal. Given this finding the deision was made to remove the right ovary. Attention was turned to the left aspect of the uterus, where after visualization of the ureter, the round ligament was coagulated and transected using the 68mm Harmonic Scapel. The anterior and posterior leafs of the broad ligament were dissected off as the anterior one was coagulated and transected in a caudal direction towards the cuff of the uterine manipulator.  Attention was then turned to the left fallopian tube which was recognized by visualization of the fimbria. The tube is excised to its attachment to the uterus. The uterine-ovarian ligament and its blood vessels were carefully coagulated and transected using the Harmonic scapel.  Attention was turned to the right aspect of the uterus. The right ovary was removed using the harmonic scalpel to coagulate and cut the right ovarian ligament and right uteroovarian ligament. Once the ovary was free it was placed out of the way of the surgical field. The hysterectomy was then resumed.  After visualization of the right ureter, the right round ligament was coagulated and transected using the 41mm Harmonic Scapel. The anterior and posterior leafs of the broad ligament were dissected off as the anterior one was coagulated and transected in a caudal direction towards the cuff of the uterine manipulator.  Attention was then turned to the right  fallopian tube which was recognized by  visualization of the fimbria. The tube is excised to its attachment to the uterus. The uterine-ovarian ligament and its blood vessels were  carefully coagulated and transected using the Harmonic scapel.  The vesicouterine reflection of the peritoneum was dissected with the harmonic scapel and the bladder flap was created bluntly.  The uterine vessels were coagulated and transected bilaterally using first bipolar cautery and then the harmonic scapel. A 360 degree, circumferential colpotomy was done to completely amputate the uterus with cervix and tubes. Once the specimen was amputated it was delivered through the vagina.  The right ovary was placed into a specimen bag through the vagina and delivered.   The colpotomy was repaired in a simple running fashion using a delayed absorbable suture with an endo-stitch device.  Vaginal exam confirms complete closure.  The cavity was copiously irrigated. A survey of the pelvic cavity revealed adequate hemostasis and no injury to bowel, bladder, or ureter.   A diagnostic cystoscopy was performed using saline distension of bladder with no lesions or injuries noted.  Bilateral urine flow from each ureteral orifice is visualized. No bladder injury was seen.  At this point the procedure was finalized. Right lower quadrant fascia incision is closed with a vicryl suture using the fascia closure device. All the instruments were removed from the patient's body. Gas was expelled and patient is leveled.  Incisions are closed with skin adhesive.    Patient was taken to the recovery room in stable condition.  All sponge, instrument, and needle counts are correct x2.    Adrian Prows MD [facility] 01/03/2019 6:43 AM

## 2018-12-04 NOTE — Discharge Instructions (Signed)
Laparoscopically Assisted Vaginal Hysterectomy, Care After This sheet gives you information about how to care for yourself after your procedure. Your health care provider may also give you more specific instructions. If you have problems or questions, contact your health care provider. What can I expect after the procedure? After the procedure, it is common to have:  Soreness and numbness in your incision areas.  Abdominal pain. You will be given pain medicine to control it.  Vaginal bleeding and discharge. You will need to use a sanitary napkin after this procedure.  Sore throat from the breathing tube that was inserted during surgery. Follow these instructions at home: Medicines  Take over-the-counter and prescription medicines only as told by your health care provider.  Do not take aspirin or ibuprofen. These medicines can cause bleeding.  Do not drive or use heavy machinery while taking prescription pain medicine.  Do not drive for 24 hours if you were given a medicine to help you relax (sedative) during the procedure. Incision care   Follow instructions from your health care provider about how to take care of your incisions. Make sure you: ? Wash your hands with soap and water before you change your bandage (dressing). If soap and water are not available, use hand sanitizer. ? Change your dressing as told by your health care provider. ? Leave stitches (sutures), skin glue, or adhesive strips in place. These skin closures may need to stay in place for 2 weeks or longer. If adhesive strip edges start to loosen and curl up, you may trim the loose edges. Do not remove adhesive strips completely unless your health care provider tells you to do that.  Check your incision area every day for signs of infection. Check for: ? Redness, swelling, or pain. ? Fluid or blood. ? Warmth. ? Pus or a bad smell. Activity  Get regular exercise as told by your health care provider. You may be  told to take short walks every day and go farther each time.  Return to your normal activities as told by your health care provider. Ask your health care provider what activities are safe for you.  Do not douche, use tampons, or have sexual intercourse for at least 12 weeks, or until your health care provider gives you permission.  Do not lift anything that is heavier than 10 lb (4.5 kg), or the limit that your health care provider tells you, until he or she says that it is safe. General instructions  Do not take baths, swim, or use a hot tub until your health care provider approves. Take showers instead of baths.  Do not drive for 24 hours if you received a sedative.  Do not drive or operate heavy machinery while taking prescription pain medicine.  To prevent or treat constipation while you are taking prescription pain medicine, your health care provider may recommend that you: ? Drink enough fluid to keep your urine clear or pale yellow. ? Take over-the-counter or prescription medicines. ? Eat foods that are high in fiber, such as fresh fruits and vegetables, whole grains, and beans. ? Limit foods that are high in fat and processed sugars, such as fried and sweet foods.  Keep all follow-up visits as told by your health care provider. This is important. Contact a health care provider if:  You have signs of infection, such as: ? Redness, swelling, or pain around your incision sites. ? Fluid or blood coming from an incision. ? An incision that feels warm to the  touch. ? Pus or a bad smell coming from an incision.  Your incision breaks open.  Your pain medicine is not helping.  You feel dizzy or light-headed.  You have pain or bleeding when you urinate.  You have persistent nausea and vomiting.  You have blood, pus, or a bad-smelling discharge from your vagina. Get help right away if:  You have a fever.  You have severe abdominal pain.  You have chest pain.  You have  shortness of breath.  You faint.  You have pain, swelling, or redness in your leg.  You have heavy bleeding from your vagina. Summary  After the procedure, it is common to have abdominal pain and vaginal bleeding.  You should not drive or lift heavy objects until your health care provider says that it is safe.  Contact your health care provider if you have any symptoms of infection, excessive vaginal bleeding, nausea, vomiting, or shortness of breath. This information is not intended to replace advice given to you by your health care provider. Make sure you discuss any questions you have with your health care provider. Document Released: 03/17/2011 Document Revised: 03/10/2017 Document Reviewed: 05/24/2016 Elsevier Patient Education  2020 [place]   1) The drugs that you were given will stay in your system until tomorrow so for the next 24 hours you should not:  A) Drive an automobile B) Make any legal decisions C) Drink any alcoholic beverage   2) You may resume regular meals tomorrow.  Today it is better to start with liquids and gradually work up to solid foods.  You may eat anything you prefer, but it is better to start with liquids, then soup and crackers, and gradually work up to solid foods.   3) Please notify your doctor immediately if you have any unusual bleeding, trouble breathing, redness and pain at the surgery site, drainage, fever, or pain not relieved by medication.    4) Additional Instructions:        Please contact your physician with any problems or Same Day Surgery at [phone], Monday through Friday 6 am to 4 pm, or [place] [facility] number at [phone].
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
## 2018-12-04 NOTE — Interval H&P Note (Signed)
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  The various methods of treatment have been discussed with the patient and family. After consideration of risks, benefits and other options for treatment, the patient has consented to  Procedure(s): TOTAL LAPAROSCOPIC HYSTERECTOMY WITH BILATERIAL SALPINGECTOMY (Bilateral) as a surgical intervention.  The patient's history has been reviewed, patient examined, no change in status, stable for surgery.  I have reviewed the patient's chart and labs.  Questions were answered to the patient's satisfaction.     [place]

## 2018-12-04 NOTE — Transfer of Care (Signed)
Immediate Anesthesia Transfer of Care Note  Patient: Betty Snyder  Procedure(s) Performed: TOTAL LAPAROSCOPIC HYSTERECTOMY WITH BILATERIAL SALPINGECTOMY (Bilateral ) CYSTOSCOPY LAPAROSCOPIC OOPHORECTOMY (Right )  Patient Location: PACU  Anesthesia Type:General  Level of Consciousness: awake and alert   Airway & Oxygen Therapy: Patient Spontanous Breathing and Patient connected to face mask oxygen  Post-op Assessment: Report given to RN and Post -op Vital signs reviewed and stable  Post vital signs: Reviewed and stable  Last Vitals:  Vitals Value Taken Time  BP    Temp    Pulse 72 12/04/18 1232  Resp 14 12/04/18 1232  SpO2 99 % 12/04/18 1232  Vitals shown include unvalidated device data.  Last Pain:  Vitals:   12/04/18 0618  TempSrc: Tympanic  PainSc: 5          Complications: No apparent anesthesia complications

## 2018-12-04 NOTE — Anesthesia Procedure Notes (Signed)
Procedure Name: Intubation Date/Time: 12/04/2018 7:53 AM Performed by: Philbert Riser, CRNA Pre-anesthesia Checklist: Patient identified, Emergency Drugs available, Suction available, Patient being monitored and Timeout performed Patient Re-evaluated:Patient Re-evaluated prior to induction Oxygen Delivery Method: Circle system utilized and Simple face mask Preoxygenation: Pre-oxygenation with 100% oxygen Induction Type: IV induction Ventilation: Mask ventilation without difficulty and Oral airway inserted - appropriate to patient size Grade View: Grade I Tube type: Oral Tube size: 7.0 mm Number of attempts: 1 Airway Equipment and Method: Stylet Placement Confirmation: ETT inserted through vocal cords under direct vision,  positive ETCO2 and breath sounds checked- equal and bilateral Secured at: 21 cm Tube secured with: Tape Dental Injury: Teeth and Oropharynx as per pre-operative assessment

## 2018-12-04 NOTE — Anesthesia Post-op Follow-up Note (Signed)
Anesthesia QCDR form completed.        

## 2018-12-04 NOTE — Progress Notes (Signed)
Pt given 62mcg of Fentanyl pt c/o pain 7/10. When pt is given Fentanyl pt becomes drowsy and O2 sats drop in to the 80's but the pt wakes up with around the same pain. Dr. Randa Lynn notified. Acknowledged. Orders received.

## 2018-12-07 LAB — SURGICAL PATHOLOGY

## 2018-12-07 LAB — CYTOLOGY - NON PAP

## 2018-12-10 NOTE — Progress Notes (Signed)
Released to mychart with note

## 2018-12-13 ENCOUNTER — Other Ambulatory Visit (INDEPENDENT_AMBULATORY_CARE_PROVIDER_SITE_OTHER): Payer: Self-pay | Admitting: Family Medicine

## 2018-12-13 ENCOUNTER — Encounter: Payer: Self-pay | Admitting: Obstetrics and Gynecology

## 2018-12-13 ENCOUNTER — Other Ambulatory Visit: Payer: Self-pay

## 2018-12-13 ENCOUNTER — Ambulatory Visit (INDEPENDENT_AMBULATORY_CARE_PROVIDER_SITE_OTHER): Payer: 59 | Admitting: Obstetrics and Gynecology

## 2018-12-13 VITALS — BP 110/70 | HR 82 | Ht 62.0 in | Wt 185.0 lb

## 2018-12-13 DIAGNOSIS — G8918 Other acute postprocedural pain: Secondary | ICD-10-CM

## 2018-12-13 DIAGNOSIS — F418 Other specified anxiety disorders: Secondary | ICD-10-CM

## 2018-12-13 DIAGNOSIS — E559 Vitamin D deficiency, unspecified: Secondary | ICD-10-CM

## 2018-12-13 DIAGNOSIS — Z90710 Acquired absence of both cervix and uterus: Secondary | ICD-10-CM

## 2018-12-13 MED ORDER — ESCITALOPRAM OXALATE 10 MG PO TABS: 10.0000 mg | ORAL_TABLET | Freq: Every evening | ORAL | 0 refills | Status: DC

## 2018-12-13 MED ORDER — OXYCODONE HCL 5 MG PO CAPS: 5.0000 mg | ORAL_CAPSULE | ORAL | 0 refills | Status: DC | PRN

## 2018-12-13 MED ORDER — VITAMIN D (ERGOCALCIFEROL) 1.25 MG (50000 UNIT) PO CAPS: 50000.0000 [IU] | ORAL_CAPSULE | ORAL | 0 refills | Status: DC

## 2018-12-13 NOTE — Progress Notes (Signed)
  Postoperative Follow-up Patient presents post op from laparoscopic assisted vaginal hysterectomy for postmenopausal bleeding and right ovarian fibroma, 1 week ago.  Subjective: Patient reports marked improvement in her preop symptoms. Eating a regular diet without difficulty. Pain is controlled with current analgesics. Medications being used: acetaminophen, ibuprofen (OTC) and narcotic analgesics including tramadol and roxicodone once before bed.  Activity: sedentary. Patient reports additional symptom's since surgery of None.  Objective: BP 110/70   Pulse 82   Ht 5\' 2"  (1.575 m)   Wt 185 lb (83.9 kg)   LMP 10/22/2013   BMI 33.84 kg/m  Physical Exam Constitutional:      Appearance: She is well-developed.  Genitourinary:     Vagina and uterus normal.     No lesions in the vagina.     No cervical motion tenderness.     No right or left adnexal mass present.  HENT:     Head: Normocephalic and atraumatic.  Neck:     Musculoskeletal: Neck supple.     Thyroid: No thyromegaly.  Cardiovascular:     Rate and Rhythm: Normal rate and regular rhythm.     Heart sounds: Normal heart sounds.  Pulmonary:     Effort: Pulmonary effort is normal.     Breath sounds: Normal breath sounds.  Chest:     Breasts:        Right: No inverted nipple, mass, nipple discharge or skin change.        Left: No inverted nipple, mass, nipple discharge or skin change.  Abdominal:     General: Bowel sounds are normal. There is no distension.     Palpations: Abdomen is soft. There is no mass.  Neurological:     Mental Status: She is alert and oriented to person, place, and time.  Skin:    General: Skin is warm and dry.  Psychiatric:        Behavior: Behavior normal.        Thought Content: Thought content normal.        Judgment: Judgment normal.  Vitals signs reviewed.     Assessment: s/p :  total laparoscopic hysterectomy with bilateral salpingectomy and right oophorectomy stable  Plan: Patient  has done well after surgery with no apparent complications.  I have discussed the post-operative course to date, and the expected progress moving forward.  The patient understands what complications to be concerned about.  I will see the patient in routine follow up, or sooner if needed.    Activity plan: No restriction.  Pelvic rest.  Betty Snyder R Betty Snyder 12/13/2018, 3:08 PM

## 2018-12-19 ENCOUNTER — Encounter (INDEPENDENT_AMBULATORY_CARE_PROVIDER_SITE_OTHER): Payer: Self-pay | Admitting: Family Medicine

## 2018-12-19 ENCOUNTER — Other Ambulatory Visit: Payer: Self-pay

## 2018-12-19 ENCOUNTER — Telehealth (INDEPENDENT_AMBULATORY_CARE_PROVIDER_SITE_OTHER): Payer: 59 | Admitting: Family Medicine

## 2018-12-19 ENCOUNTER — Ambulatory Visit: Payer: 59 | Admitting: Obstetrics and Gynecology

## 2018-12-19 DIAGNOSIS — Z6833 Body mass index (BMI) 33.0-33.9, adult: Secondary | ICD-10-CM | POA: Diagnosis not present

## 2018-12-19 DIAGNOSIS — E8881 Metabolic syndrome: Secondary | ICD-10-CM | POA: Diagnosis not present

## 2018-12-19 DIAGNOSIS — E669 Obesity, unspecified: Secondary | ICD-10-CM
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
## 2018-12-20 ENCOUNTER — Ambulatory Visit: Payer: 59 | Admitting: Obstetrics and Gynecology

## 2018-12-28 ENCOUNTER — Ambulatory Visit: Payer: 59 | Admitting: Obstetrics and Gynecology

## 2019-01-03 ENCOUNTER — Ambulatory Visit (INDEPENDENT_AMBULATORY_CARE_PROVIDER_SITE_OTHER): Payer: 59 | Admitting: Family Medicine

## 2019-01-07 ENCOUNTER — Ambulatory Visit (INDEPENDENT_AMBULATORY_CARE_PROVIDER_SITE_OTHER): Payer: 59 | Admitting: Family Medicine

## 2019-01-07 ENCOUNTER — Other Ambulatory Visit: Payer: Self-pay

## 2019-01-07 VITALS — BP 114/74 | HR 70 | Temp 98.5°F | Ht 62.0 in | Wt 185.0 lb

## 2019-01-07 DIAGNOSIS — F418 Other specified anxiety disorders: Secondary | ICD-10-CM | POA: Diagnosis not present

## 2019-01-07 DIAGNOSIS — E669 Obesity, unspecified: Secondary | ICD-10-CM

## 2019-01-07 DIAGNOSIS — Z9189 Other specified personal risk factors, not elsewhere classified: Secondary | ICD-10-CM

## 2019-01-07 DIAGNOSIS — Z6833 Body mass index (BMI) 33.0-33.9, adult: Secondary | ICD-10-CM

## 2019-01-07 DIAGNOSIS — E559 Vitamin D deficiency, unspecified: Secondary | ICD-10-CM | POA: Diagnosis not present

## 2019-01-07 MED ORDER — VITAMIN D (ERGOCALCIFEROL) 1.25 MG (50000 UNIT) PO CAPS: 50000.0000 [IU] | ORAL_CAPSULE | ORAL | 0 refills | Status: DC

## 2019-01-07 MED ORDER — ESCITALOPRAM OXALATE 10 MG PO TABS: 10.0000 mg | ORAL_TABLET | Freq: Every evening | ORAL | 0 refills | Status: DC

## 2019-01-08 ENCOUNTER — Encounter (INDEPENDENT_AMBULATORY_CARE_PROVIDER_SITE_OTHER): Payer: Self-pay | Admitting: Family Medicine
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
## 2019-01-10 ENCOUNTER — Other Ambulatory Visit: Payer: Self-pay

## 2019-01-10 ENCOUNTER — Ambulatory Visit (INDEPENDENT_AMBULATORY_CARE_PROVIDER_SITE_OTHER): Payer: 59 | Admitting: Obstetrics and Gynecology

## 2019-01-10 VITALS — BP 124/70 | Ht 61.0 in | Wt 190.0 lb

## 2019-01-10 DIAGNOSIS — Z90710 Acquired absence of both cervix and uterus: Secondary | ICD-10-CM

## 2019-01-10 NOTE — Progress Notes (Signed)
  Postoperative Follow-up Patient presents post op from laparoscopic assisted vaginal hysterectomy for postmenopausal bleeding and right ovarian fibroma, 4 weeks ago.  Subjective: Patient reports marked improvement in her preop symptoms. Eating a regular diet without difficulty. Pain is controlled with out  current analgesics.  Activity: normal activities. Patient reports additional symptom's since surgery of None. Objective: BP 124/70   Ht 5\' 1"  (1.549 m)   Wt 190 lb (86.2 kg)   LMP 10/22/2013   BMI 35.90 kg/m  OBGyn Exam  Assessment: s/p :  total laparoscopic hysterectomy with bilateral salpingectomy and right oophorectomy stable  Plan: Patient has done well after surgery with no apparent complications.  I have discussed the post-operative course to date, and the expected progress moving forward.  The patient understands what complications to be concerned about.  I will see the patient in routine follow up, or sooner if needed.    Activity plan: May return to work in 0 days.  Pelvic rest and no heavy lifting for 12 weeks.  Betty Snyder R Betty Snyder 01/10/2019, 10:07 AM

## 2019-01-31 ENCOUNTER — Ambulatory Visit (INDEPENDENT_AMBULATORY_CARE_PROVIDER_SITE_OTHER): Payer: 59 | Admitting: Family Medicine

## 2019-02-13 ENCOUNTER — Other Ambulatory Visit: Payer: 59

## 2019-02-14 ENCOUNTER — Other Ambulatory Visit (INDEPENDENT_AMBULATORY_CARE_PROVIDER_SITE_OTHER): Payer: Self-pay | Admitting: Family Medicine

## 2019-02-14 DIAGNOSIS — E559 Vitamin D deficiency, unspecified: Secondary | ICD-10-CM

## 2019-02-14 DIAGNOSIS — F418 Other specified anxiety disorders: Secondary | ICD-10-CM

## 2019-02-15 ENCOUNTER — Encounter (INDEPENDENT_AMBULATORY_CARE_PROVIDER_SITE_OTHER): Payer: Self-pay | Admitting: Family Medicine

## 2019-02-18 ENCOUNTER — Encounter (INDEPENDENT_AMBULATORY_CARE_PROVIDER_SITE_OTHER): Payer: Self-pay | Admitting: Family Medicine

## 2019-02-18 ENCOUNTER — Other Ambulatory Visit: Payer: Self-pay | Admitting: Family Medicine

## 2019-02-18 ENCOUNTER — Other Ambulatory Visit (INDEPENDENT_AMBULATORY_CARE_PROVIDER_SITE_OTHER): Payer: Self-pay | Admitting: Family Medicine

## 2019-02-18 ENCOUNTER — Other Ambulatory Visit: Payer: Self-pay

## 2019-02-18 ENCOUNTER — Ambulatory Visit (INDEPENDENT_AMBULATORY_CARE_PROVIDER_SITE_OTHER): Payer: 59 | Admitting: Family Medicine

## 2019-02-18 ENCOUNTER — Encounter: Payer: 59 | Admitting: Family Medicine

## 2019-02-18 VITALS — BP 115/78 | HR 69 | Temp 98.2°F | Ht 62.0 in | Wt 192.0 lb

## 2019-02-18 DIAGNOSIS — E559 Vitamin D deficiency, unspecified: Secondary | ICD-10-CM

## 2019-02-18 DIAGNOSIS — Z6835 Body mass index (BMI) 35.0-35.9, adult: Secondary | ICD-10-CM

## 2019-02-18 DIAGNOSIS — R7303 Prediabetes: Secondary | ICD-10-CM | POA: Diagnosis not present

## 2019-02-18 DIAGNOSIS — Z9189 Other specified personal risk factors, not elsewhere classified: Secondary | ICD-10-CM | POA: Diagnosis not present

## 2019-02-18 DIAGNOSIS — F3289 Other specified depressive episodes: Secondary | ICD-10-CM

## 2019-02-18 DIAGNOSIS — Z1231 Encounter for screening mammogram for malignant neoplasm of breast: Secondary | ICD-10-CM

## 2019-02-18 MED ORDER — VITAMIN D (ERGOCALCIFEROL) 1.25 MG (50000 UNIT) PO CAPS: 50000.0000 [IU] | ORAL_CAPSULE | ORAL | 0 refills | Status: DC

## 2019-02-18 MED ORDER — ESCITALOPRAM OXALATE 20 MG PO TABS: 20.0000 mg | ORAL_TABLET | Freq: Every day | ORAL | 0 refills | Status: DC
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
## 2019-02-19 LAB — LIPID PANEL WITH LDL/HDL RATIO
Cholesterol, Total: 247 mg/dL — ABNORMAL HIGH (ref 100–199)
HDL: 45 mg/dL (ref >39)
LDL Chol Calc (NIH): 163 mg/dL — ABNORMAL HIGH (ref 0–99)
LDL/HDL Ratio: 3.6 ratio — ABNORMAL HIGH (ref 0.0–3.2)
Triglycerides: 210 mg/dL — ABNORMAL HIGH (ref 0–149)
VLDL Cholesterol Cal: 39 mg/dL (ref 5–40)

## 2019-02-19 LAB — COMPREHENSIVE METABOLIC PANEL
ALT: 20 IU/L (ref 0–32)
AST: 18 IU/L (ref 0–40)
Albumin/Globulin Ratio: 2 (ref 1.2–2.2)
Albumin: 4.5 g/dL (ref 3.8–4.9)
Alkaline Phosphatase: 79 IU/L (ref 39–117)
BUN/Creatinine Ratio: 16 (ref 9–23)
BUN: 12 mg/dL (ref 6–24)
Bilirubin Total: 0.2 mg/dL (ref 0.0–1.2)
CO2: 25 mmol/L (ref 20–29)
Calcium: 9.1 mg/dL (ref 8.7–10.2)
Chloride: 103 mmol/L (ref 96–106)
Creatinine, Ser: 0.74 mg/dL (ref 0.57–1.00)
GFR calc Af Amer: 106 mL/min/{1.73_m2} (ref >59)
GFR calc non Af Amer: 92 mL/min/{1.73_m2} (ref >59)
Globulin, Total: 2.3 g/dL (ref 1.5–4.5)
Glucose: 88 mg/dL (ref 65–99)
Potassium: 4.4 mmol/L (ref 3.5–5.2)
Sodium: 140 mmol/L (ref 134–144)
Total Protein: 6.8 g/dL (ref 6.0–8.5)

## 2019-02-19 LAB — VITAMIN D 25 HYDROXY (VIT D DEFICIENCY, FRACTURES): Vit D, 25-Hydroxy: 26.4 ng/mL — ABNORMAL LOW (ref 30.0–100.0)

## 2019-02-19 LAB — HEMOGLOBIN A1C
Est. average glucose Bld gHb Est-mCnc: 117 mg/dL
Hgb A1c MFr Bld: 5.7 % — ABNORMAL HIGH (ref 4.8–5.6)

## 2019-02-19 LAB — INSULIN, RANDOM: INSULIN: 15.7 u[IU]/mL (ref 2.6–24.9)

## 2019-02-25 ENCOUNTER — Encounter: Payer: Self-pay | Admitting: Family Medicine

## 2019-02-25 ENCOUNTER — Ambulatory Visit (INDEPENDENT_AMBULATORY_CARE_PROVIDER_SITE_OTHER): Payer: 59 | Admitting: Family Medicine

## 2019-02-25 ENCOUNTER — Other Ambulatory Visit: Payer: Self-pay

## 2019-02-25 VITALS — BP 108/64 | HR 71 | Temp 98.4°F | Ht 62.0 in | Wt 196.1 lb

## 2019-02-25 DIAGNOSIS — Z87891 Personal history of nicotine dependence: Secondary | ICD-10-CM | POA: Diagnosis not present

## 2019-02-25 DIAGNOSIS — Z23 Encounter for immunization: Secondary | ICD-10-CM | POA: Diagnosis not present

## 2019-02-25 DIAGNOSIS — Z0000 Encounter for general adult medical examination without abnormal findings: Secondary | ICD-10-CM

## 2019-02-25 DIAGNOSIS — Z801 Family history of malignant neoplasm of trachea, bronchus and lung: Secondary | ICD-10-CM
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
## 2019-02-25 NOTE — Patient Instructions (Addendum)
Call you insurance company and ask if [employer] is covered in the office or if you have to go to your pharmacy, if it is covered here, just call and schedule a nurse visit  You will get a call from the lung cancer screening nurse   Health Maintenance, Female Adopting a healthy lifestyle and getting preventive care are important in promoting health and wellness. Ask your health care provider about:  The right schedule for you to have regular tests and exams.  Things you can do on your own to prevent diseases and keep yourself healthy. What should I know about diet, weight, and exercise? Eat a healthy diet   Eat a diet that includes plenty of vegetables, fruits, low-fat dairy products, and lean protein.  Do not eat a lot of foods that are high in solid fats, added sugars, or sodium. Maintain a healthy weight Body mass index (BMI) is used to identify weight problems. It estimates body fat based on height and weight. Your health care provider can help determine your BMI and help you achieve or maintain a healthy weight. Get regular exercise Get regular exercise. This is one of the most important things you can do for your health. Most adults should:  Exercise for at least 150 minutes each week. The exercise should increase your heart rate and make you sweat (moderate-intensity exercise).  Do strengthening exercises at least twice a week. This is in addition to the moderate-intensity exercise.  Spend less time sitting. Even light physical activity can be beneficial. Watch cholesterol and blood lipids Have your blood tested for lipids and cholesterol at 54 years of age, then have this test every 5 years. Have your cholesterol levels checked more often if:  Your lipid or cholesterol levels are high.  You are older than 54 years of age.  You are at high risk for heart disease. What should I know about cancer screening? Depending on your health history and family history, you may need to  have cancer screening at various ages. This may include screening for:  Breast cancer.  Cervical cancer.  Colorectal cancer.  Skin cancer.  Lung cancer. What should I know about heart disease, diabetes, and high blood pressure? Blood pressure and heart disease  High blood pressure causes heart disease and increases the risk of stroke. This is more likely to develop in people who have high blood pressure readings, are of African descent, or are overweight.  Have your blood pressure checked: ? Every 3-5 years if you are 51-47 years of age. ? Every year if you are 40 years old or older. Diabetes Have regular diabetes screenings. This checks your fasting blood sugar level. Have the screening done:  Once every three years after age 65 if you are at a normal weight and have a low risk for diabetes.  More often and at a younger age if you are overweight or have a high risk for diabetes. What should I know about preventing infection? Hepatitis B If you have a higher risk for hepatitis B, you should be screened for this virus. Talk with your health care provider to find out if you are at risk for hepatitis B infection. Hepatitis C Testing is recommended for:  Everyone born from [birth year] through 1966.  Anyone with known risk factors for hepatitis C. Sexually transmitted infections (STIs)  Get screened for STIs, including gonorrhea and chlamydia, if: ? You are sexually active and are younger than 54 years of age. ? You are older than  54 years of age and your health care provider tells you that you are at risk for this type of infection. ? Your sexual activity has changed since you were last screened, and you are at increased risk for chlamydia or gonorrhea. Ask your health care provider if you are at risk.  Ask your health care provider about whether you are at high risk for HIV. Your health care provider may recommend a prescription medicine to help prevent HIV infection. If you choose to  take medicine to prevent HIV, you should first get tested for HIV. You should then be tested every 3 months for as long as you are taking the medicine. Pregnancy  If you are about to stop having your period (premenopausal) and you may become pregnant, seek counseling before you get pregnant.  Take 400 to 800 micrograms (mcg) of folic acid every day if you become pregnant.  Ask for birth control (contraception) if you want to prevent pregnancy. Osteoporosis and menopause Osteoporosis is a disease in which the bones lose minerals and strength with aging. This can result in bone fractures. If you are 20 years old or older, or if you are at risk for osteoporosis and fractures, ask your health care provider if you should:  Be screened for bone loss.  Take a calcium or vitamin D supplement to lower your risk of fractures.  Be given hormone replacement therapy (HRT) to treat symptoms of menopause. Follow these instructions at home: Lifestyle  Do not use any products that contain nicotine or tobacco, such as cigarettes, e-cigarettes, and chewing tobacco. If you need help quitting, ask your health care provider.  Do not use street drugs.  Do not share needles.  Ask your health care provider for help if you need support or information about quitting drugs. Alcohol use  Do not drink alcohol if: ? Your health care provider tells you not to drink. ? You are pregnant, may be pregnant, or are planning to become pregnant.  If you drink alcohol: ? Limit how much you use to 0-1 drink a day. ? Limit intake if you are breastfeeding.  Be aware of how much alcohol is in your drink. In the [place] one drink equals one 12 oz bottle of beer (355 mL), one 5 oz glass of wine (148 mL), or one 1 oz glass of hard liquor (44 mL). General instructions  Schedule regular health, dental, and eye exams.  Stay current with your vaccines.  Tell your health care provider if: ? You often feel depressed. ? You  have ever been abused or do not feel safe at home. Summary  Adopting a healthy lifestyle and getting preventive care are important in promoting health and wellness.  Follow your health care provider's instructions about healthy diet, exercising, and getting tested or screened for diseases.  Follow your health care provider's instructions on monitoring your cholesterol and blood pressure. This information is not intended to replace advice given to you by your health care provider. Make sure you discuss any questions you have with your health care provider. Document Released: 10/11/2010 Document Revised: 03/21/2018 Document Reviewed: 03/21/2018 Elsevier Patient Education  2020 [employer].

## 2019-02-26 ENCOUNTER — Encounter (HOSPITAL_COMMUNITY): Payer: Self-pay | Admitting: *Deleted

## 2019-02-26 NOTE — Progress Notes (Signed)
This patient was initially referred to [place].  I called her today. She doesn't turn 54 until end of January.  She started smoking at 15, initially smoking 1 ppd.  She quit smoking at least 5 years ago.  She denies any symptoms of lung cancer at this time.  Patient lives in [place] so she prefers to go to [place] for their lung cancer screening program.  I have sent a message to Eric Form, NP and re-routed the referral.

## 2019-03-05 ENCOUNTER — Other Ambulatory Visit: Payer: Self-pay

## 2019-03-05 DIAGNOSIS — Z20822 Contact with and (suspected) exposure to covid-19: Secondary | ICD-10-CM

## 2019-03-06 ENCOUNTER — Ambulatory Visit: Payer: 59 | Admitting: Obstetrics and Gynecology

## 2019-03-06 ENCOUNTER — Telehealth: Payer: Self-pay | Admitting: *Deleted

## 2019-03-06 LAB — NOVEL CORONAVIRUS, NAA: SARS-CoV-2, NAA: DETECTED — AB

## 2019-03-06 NOTE — Telephone Encounter (Signed)
Received referral for lung screening. Noted that patient has been contacted from [facility] for lung screening. Left voicemail for patient to contact me if she would rather have lung screening in [place].

## 2019-03-07 ENCOUNTER — Encounter (INDEPENDENT_AMBULATORY_CARE_PROVIDER_SITE_OTHER): Payer: Self-pay | Admitting: Family Medicine

## 2019-03-11 ENCOUNTER — Ambulatory Visit (INDEPENDENT_AMBULATORY_CARE_PROVIDER_SITE_OTHER): Payer: 59 | Admitting: Family Medicine

## 2019-03-12 ENCOUNTER — Encounter: Payer: Self-pay | Admitting: *Deleted

## 2019-03-12 ENCOUNTER — Ambulatory Visit: Payer: Self-pay

## 2019-03-12 NOTE — Telephone Encounter (Signed)
Provided covid-19.   Test results.  Voiced understanding.  Provided  Care advice.  Patient states  That she has fatigue, no fever one episode of diarrhea.  fainted earlier today.

## 2019-03-12 NOTE — Telephone Encounter (Signed)
I was unable to speak with pt about the fainting episode earlier today. Left v/m requesting pt to call [place].

## 2019-03-13 NOTE — Telephone Encounter (Signed)
Left message on voicemail for patient to call back. 

## 2019-03-13 NOTE — Telephone Encounter (Signed)
I was unable to speak with pt but left v/m asking pt to call [place].

## 2019-03-13 NOTE — Telephone Encounter (Signed)
Called and spoke with patient who reports she is feeling much better today. No fever. Some chills. No cough. Her chest feels heavy. She has a headache, fatigue and diarrhea. Eating soup and crackers today. Had a syncopal episode several days ago while getting into the shower. She was very weak. Was out for about 10 seconds. No further episodes. No injury to head. She has been quarantined since testing.  She tried to call back earlier and got message.  Please add her to Covid call list.

## 2019-03-15 NOTE — Telephone Encounter (Signed)
Patient advised of everything. Advised patient if she has any concerns or questions next week to give [name] a call and another provider can try and help in Debbie's absence then

## 2019-03-15 NOTE — Telephone Encounter (Signed)
Call patient let her know that it sounds as though her symptoms are moving in the right direction.  Signs of pneumonia would include increased sputum production, wheezing, increased shortness of breath and chest heaviness.  She can take Mucinex to help loosen chest congestion.  Important to drink enough water to keep urine light yellow.

## 2019-03-15 NOTE — Telephone Encounter (Signed)
Spoke with patient today. Patient states chest does not feel as heavy as it was, diarrhea off and on, mild headache present but so much better than it was. States it is more of a residual headache if she turns her head a certain way, etc, not constant h/a. Coughs once in a while-not sure if phlegm is building up in her chest. Not COVID type cough per patient. No chest rattling. No fever, no body aches, no chills.  Patient states her friend that she has been staying with this whole time was sent to the hospital last night with COVID pneumonia. Patient wants to know if this is something she herself should be worried about? She is worried about her chest. NO SOB present at this time.

## 2019-03-22 ENCOUNTER — Other Ambulatory Visit (INDEPENDENT_AMBULATORY_CARE_PROVIDER_SITE_OTHER): Payer: Self-pay | Admitting: Family Medicine

## 2019-03-22 DIAGNOSIS — F3289 Other specified depressive episodes: Secondary | ICD-10-CM

## 2019-03-22 DIAGNOSIS — E559 Vitamin D deficiency, unspecified: Secondary | ICD-10-CM

## 2019-03-25 ENCOUNTER — Encounter (INDEPENDENT_AMBULATORY_CARE_PROVIDER_SITE_OTHER): Payer: Self-pay | Admitting: Family Medicine

## 2019-03-27 ENCOUNTER — Other Ambulatory Visit: Payer: Self-pay

## 2019-03-27 ENCOUNTER — Encounter (INDEPENDENT_AMBULATORY_CARE_PROVIDER_SITE_OTHER): Payer: Self-pay | Admitting: Family Medicine

## 2019-03-27 ENCOUNTER — Telehealth (INDEPENDENT_AMBULATORY_CARE_PROVIDER_SITE_OTHER): Payer: 59 | Admitting: Family Medicine

## 2019-03-27 DIAGNOSIS — R7303 Prediabetes: Secondary | ICD-10-CM

## 2019-03-27 DIAGNOSIS — E782 Mixed hyperlipidemia: Secondary | ICD-10-CM

## 2019-03-27 DIAGNOSIS — Z6835 Body mass index (BMI) 35.0-35.9, adult: Secondary | ICD-10-CM

## 2019-03-27 DIAGNOSIS — F3289 Other specified depressive episodes: Secondary | ICD-10-CM | POA: Diagnosis not present

## 2019-03-27 DIAGNOSIS — E559 Vitamin D deficiency, unspecified: Secondary | ICD-10-CM

## 2019-03-29 ENCOUNTER — Encounter (INDEPENDENT_AMBULATORY_CARE_PROVIDER_SITE_OTHER): Payer: Self-pay | Admitting: Family Medicine

## 2019-03-29 ENCOUNTER — Other Ambulatory Visit (INDEPENDENT_AMBULATORY_CARE_PROVIDER_SITE_OTHER): Payer: Self-pay | Admitting: Family Medicine

## 2019-03-29 DIAGNOSIS — E559 Vitamin D deficiency, unspecified: Secondary | ICD-10-CM

## 2019-03-29 DIAGNOSIS — F3289 Other specified depressive episodes: Secondary | ICD-10-CM

## 2019-04-01 MED ORDER — ESCITALOPRAM OXALATE 20 MG PO TABS: 20.0000 mg | ORAL_TABLET | Freq: Every day | ORAL | 0 refills | Status: DC

## 2019-04-01 MED ORDER — VITAMIN D (ERGOCALCIFEROL) 1.25 MG (50000 UNIT) PO CAPS: 50000.0000 [IU] | ORAL_CAPSULE | ORAL | 0 refills | Status: DC
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
## 2019-04-11 ENCOUNTER — Ambulatory Visit
Admission: RE | Admit: 2019-04-11 | Discharge: 2019-04-11 | Disposition: A | Discharge status: Home / Self Care | Payer: 59 | Source: Ambulatory Visit | Attending: Family Medicine | Admitting: Family Medicine

## 2019-04-11 ENCOUNTER — Other Ambulatory Visit: Payer: Self-pay

## 2019-04-11 DIAGNOSIS — Z1231 Encounter for screening mammogram for malignant neoplasm of breast: Secondary | ICD-10-CM

## 2019-04-15 ENCOUNTER — Telehealth: Payer: Self-pay | Admitting: Acute Care

## 2019-04-15 DIAGNOSIS — Z87891 Personal history of nicotine dependence: Secondary | ICD-10-CM

## 2019-04-15 NOTE — Telephone Encounter (Signed)
I spoke with pt and she would like to start lung screening program in [place] since she is staying with family now that is closer to [place].  I advised pt that I will call her back to schedule once my February schedule has opened.  Please leave message in my box for f/u.

## 2019-04-15 NOTE — Telephone Encounter (Signed)
[place] x 1 for pt- Does she want to start lung screening at [facility] or [place] location?

## 2019-04-17 ENCOUNTER — Telehealth (INDEPENDENT_AMBULATORY_CARE_PROVIDER_SITE_OTHER): Payer: 59 | Admitting: Family Medicine

## 2019-04-18 ENCOUNTER — Other Ambulatory Visit: Payer: Self-pay

## 2019-04-18 ENCOUNTER — Telehealth (INDEPENDENT_AMBULATORY_CARE_PROVIDER_SITE_OTHER): Payer: 59 | Admitting: Family Medicine

## 2019-04-18 ENCOUNTER — Encounter (INDEPENDENT_AMBULATORY_CARE_PROVIDER_SITE_OTHER): Payer: Self-pay | Admitting: Family Medicine

## 2019-04-18 DIAGNOSIS — Z6835 Body mass index (BMI) 35.0-35.9, adult: Secondary | ICD-10-CM | POA: Diagnosis not present

## 2019-04-18 DIAGNOSIS — E559 Vitamin D deficiency, unspecified: Secondary | ICD-10-CM

## 2019-04-18 DIAGNOSIS — F3289 Other specified depressive episodes: Secondary | ICD-10-CM | POA: Diagnosis not present

## 2019-04-18 MED ORDER — ESCITALOPRAM OXALATE 20 MG PO TABS: 20.0000 mg | ORAL_TABLET | Freq: Every day | ORAL | 0 refills | Status: DC

## 2019-04-18 MED ORDER — VITAMIN D (ERGOCALCIFEROL) 1.25 MG (50000 UNIT) PO CAPS: 50000.0000 [IU] | ORAL_CAPSULE | ORAL | 0 refills | Status: DC

## 2019-04-18 NOTE — Telephone Encounter (Signed)
Spoke with pt and scheduled SD[facility] 05/13/19 3:00 CT ordered Nothing further needed

## 2019-04-24 NOTE — Progress Notes (Signed)
TeleHealth Visit:  Due to the COVID-19 pandemic, this visit was completed with telemedicine (audio/video) technology to reduce patient and provider exposure as well as to preserve personal protective equipment.   Betty Snyder has verbally consented to this TeleHealth visit. The patient is located at home, the provider is located at the [facility] office. The participants in this visit include the listed provider and patient. Betty Snyder was unable to use realtime audiovisual technology today and the telehealth visit was conducted via telephone.  Chief Complaint: Betty Snyder is here to discuss her progress with her Betty treatment plan along with follow-up of her Betty related diagnoses. Betty Snyder is on the Category 2 Plan and states she is following her eating plan approximately 55% of the time. Betty Snyder states she is doing 0 minutes 0 times per week.  Today's visit was #: 15 Starting weight: 190 lbs Starting date: 05/08/2018  Interim History: Betty Snyder did some celebration eating over the holidays, and she may have gained a bit of weight. She is happy with breakfast and lunch, but she is getting bored with dinner.  Subjective:   1. Other depression Betty Snyder is stable on Lexapro. She had been out for 3-4 days due to pharmacy error, and she did not feel as well. She is back on Lexapro and working on decreasing emotional eating.  2. Vitamin D deficiency Betty Snyder is stable on Vit D, but level is not yet at goal. She denies nausea, vomiting, or muscle weakness.  Assessment/Plan:   1. Other depression Behavior modification techniques were discussed today to help Betty Snyder deal with her emotional/non-hunger eating behaviors.  We will refill Lexapro for 1 month, and will continue to monitor. Orders and follow up as documented in patient record.   - escitalopram (LEXAPRO) 20 MG tablet; Take 1 tablet (20 mg total) by mouth daily.  Dispense: 30 tablet; Refill: 0  2. Vitamin D  deficiency Low Vitamin D level contributes to fatigue and are associated with Betty, breast, and colon cancer. We will refill prescription Vit D for 1 month. She will follow-up for routine testing of vitamin D, at least 2-3 times per year to avoid over-replacement. We will continue to monitor.  - Vitamin D, Ergocalciferol, (DRISDOL) 1.25 MG (50000 UT) CAPS capsule; Take 1 capsule (50,000 Units total) by mouth every 7 (seven) days.  Dispense: 4 capsule; Refill: 0  3. 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 As such, her goal is to continue with weight loss efforts. She has agreed to on the Category 2 Plan.   We discussed the following exercise goals today: For substantial health benefits, adults should do at least 150 minutes (2 hours and 30 minutes) a week of moderate-intensity, or 75 minutes (1 hour and 15 minutes) a week of vigorous-intensity aerobic physical activity, or an equivalent combination of moderate- and vigorous-intensity aerobic activity. Aerobic activity should be performed in episodes of at least 10 minutes, and preferably, it should be spread throughout the week. Adults should also include muscle-strengthening activities that involve all major muscle groups on 2 or more days a week.  We discussed the following behavioral modification strategies today: increasing vegetables.  Betty Snyder has agreed to follow-up with our clinic in 3 to 4 weeks. She was informed of the importance of frequent follow-up visits to maximize her success with intensive  lifestyle modifications for her multiple health conditions.  Objective:   VITALS: Per patient if applicable, see vitals. GENERAL: Alert and in no acute distress. CARDIOPULMONARY: No increased WOB. Speaking in clear sentences.  PSYCH: Pleasant and cooperative. Speech normal rate and rhythm. Affect  is appropriate. Insight and judgement are appropriate. Attention is focused, linear, and appropriate.  NEURO: Oriented as arrived to appointment on time with no prompting.   Lab Results  Component Value Date   CREATININE 0.74 02/18/2019   BUN 12 02/18/2019   NA 140 02/18/2019   K 4.4 02/18/2019   CL 103 02/18/2019   CO2 25 02/18/2019   Lab Results  Component Value Date   ALT 20 02/18/2019   AST 18 02/18/2019   ALKPHOS 79 02/18/2019   BILITOT 0.2 02/18/2019   Lab Results  Component Value Date   HGBA1C 5.7 (H) 02/18/2019   HGBA1C 5.6 10/16/2018   HGBA1C 5.5 05/08/2018   HGBA1C 5.9 11/15/2017   HGBA1C 5.9 02/18/2016   Lab Results  Component Value Date   INSULIN 15.7 02/18/2019   INSULIN 12.0 10/16/2018   INSULIN 8.3 05/08/2018   Lab Results  Component Value Date   TSH 2.620 05/08/2018   Lab Results  Component Value Date   CHOL 247 (H) 02/18/2019   HDL 45 02/18/2019   LDLCALC 163 (H) 02/18/2019   TRIG 210 (H) 02/18/2019   CHOLHDL 5 11/15/2017   Lab Results  Component Value Date   WBC 6.6 11/30/2018   HGB 13.8 11/30/2018   HCT 43.5 11/30/2018   MCV 91.8 11/30/2018   PLT 218 11/30/2018   No results found for: IRON, TIBC, FERRITIN  Attestation Statements:   Reviewed by clinician on day of visit: allergies, medications, problem list, medical history, surgical history, family history, social history, and previous encounter notes.   I, Trixie Dredge, am acting as transcriptionist for Dennard Nip, MD.  I have reviewed the above documentation for accuracy and completeness, and I agree with the above. - Dennard Nip, MD

## 2019-05-10 ENCOUNTER — Telehealth: Payer: Self-pay | Admitting: *Deleted

## 2019-05-10 NOTE — Telephone Encounter (Signed)
Called and spoke with pt. Pt is requesting to know if her insurance will cover the cost of shared decision visit and the ct which are both scheduled Monday, 2/1. Pt is scheduled to have the shared decision visit at 3pm followed by the CT at 4pm.  Stated to pt that I would find out the info for her and we would get back with her as soon as we could and she verbalized understanding.   Judson Roch and Langley Gauss, please advise on this for pt. Thanks!

## 2019-05-10 NOTE — Telephone Encounter (Signed)
Thank you!   Called and spoke with pt letting her know the info stated by Judson Roch and she verbalized understanding. Nothing further needed.

## 2019-05-10 NOTE — Telephone Encounter (Signed)
These are all pre certified by Valente David. Thanks

## 2019-05-13 ENCOUNTER — Ambulatory Visit (INDEPENDENT_AMBULATORY_CARE_PROVIDER_SITE_OTHER): Payer: 59 | Admitting: Acute Care

## 2019-05-13 ENCOUNTER — Ambulatory Visit (INDEPENDENT_AMBULATORY_CARE_PROVIDER_SITE_OTHER)
Admission: RE | Admit: 2019-05-13 | Discharge: 2019-05-13 | Disposition: A | Discharge status: Home / Self Care | Payer: 59 | Source: Ambulatory Visit | Attending: Acute Care | Admitting: Acute Care

## 2019-05-13 ENCOUNTER — Other Ambulatory Visit: Payer: Self-pay

## 2019-05-13 ENCOUNTER — Encounter: Payer: Self-pay | Admitting: Acute Care

## 2019-05-13 VITALS — BP 120/72 | HR 91 | Temp 97.6°F | Ht 62.0 in | Wt 195.4 lb

## 2019-05-13 DIAGNOSIS — Z87891 Personal history of nicotine dependence: Secondary | ICD-10-CM | POA: Diagnosis not present

## 2019-05-13 NOTE — Progress Notes (Addendum)
Shared Decision Making Visit Lung Cancer Screening Program [phone])   Eligibility:  Age 54 y.o.  Pack Years Smoking History Calculation 47 pack year smoking history (# packs/per year x # years smoked)  Recent History of coughing up blood  no  Unexplained weight loss? no ( >Than 15 pounds within the last 6 months )  Prior History Lung / other cancer no (Diagnosis within the last 5 years already requiring surveillance chest CT Scans).  Smoking Status Former Smoker  Former Smokers: Years since quit: 10 years  Quit Date: 2010  Visit Components:  Discussion included one or more decision making aids. yes  Discussion included risk/benefits of screening. yes  Discussion included potential follow up diagnostic testing for abnormal scans. yes  Discussion included meaning and risk of over diagnosis. yes  Discussion included meaning and risk of False Positives. yes  Discussion included meaning of total radiation exposure. yes  Counseling Included:  Importance of adherence to annual lung cancer LDCT screening. yes  Impact of comorbidities on ability to participate in the program. yes  Ability and willingness to under diagnostic treatment. yes  Smoking Cessation Counseling:  Current Smokers:   Discussed importance of smoking cessation. yes  Information about tobacco cessation classes and interventions provided to patient. yes  Patient provided with "ticket" for LDCT Scan. yes  Symptomatic Patient. no  Counseling  Diagnosis Code: Tobacco Use Z72.0  Asymptomatic Patient yes  Counseling (Intermediate counseling: > three minutes counseling) [id number]  Former Smokers:   Discussed the importance of maintaining cigarette abstinence. yes  Diagnosis Code: Personal History of Nicotine Dependence. [id number]  Information about tobacco cessation classes and interventions provided to patient. Yes  Patient provided with "ticket" for LDCT Scan. yes  Written Order for Lung Cancer  Screening with LDCT placed in Epic. Yes (CT Chest Lung Cancer Screening Low Dose W/O CM) [id number] Z12.2-Screening of respiratory organs Z87.891-Personal history of nicotine dependence  BP 120/72 (BP Location: Right Arm, Patient Position: Sitting, Cuff Size: Normal)   Pulse 91   Temp 97.6 F (36.4 C) (Temporal)   Ht 5\' 2"  (1.575 m)   Wt 195 lb 6.4 oz (88.6 kg)   LMP 10/22/2013   SpO2 92% Comment: on RA  BMI 35.74 kg/m    I spent 25 minutes of face to face time with Ms. Omalley discussing the risks and benefits of lung cancer screening. We viewed a power point together that explained in detail the above noted topics. We took the time to pause the power point at intervals to allow for questions to be asked and answered to ensure understanding. We discussed that she had taken the single most powerful action possible to decrease her risk of developing lung cancer when she quit smoking. I counseled her to remain smoke free, and to contact me if she ever had the desire to smoke again so that I can provide resources and tools to help support the effort to remain smoke free. We discussed the time and location of the scan, and that either  Doroteo Glassman RN or I will call with the results within  24-48 hours of receiving them. She has my card and contact information in the event she needs to speak with me, in addition to a copy of the power point we reviewed as a resource. She verbalized understanding of all of the above and had no further questions upon leaving the office.     I explained to the patient that there has been a high incidence of  coronary artery disease noted on these exams. I explained that this is a non-gated exam therefore degree or severity cannot be determined. This patient is not on statin therapy. I have asked the patient to follow-up with their PCP regarding any incidental finding of coronary artery disease and management with diet or medication as they feel is clinically indicated.  The patient verbalized understanding of the above and had no further questions.     Magdalen Spatz, NP 05/13/2019 3:39 PM

## 2019-05-13 NOTE — Patient Instructions (Signed)
Thank you for participating in the [facility]. It was our pleasure to meet you today. We will call you with the results of your scan within the next few days. Your scan will be assigned a Lung RADS category score by the physicians reading the scans.  This Lung RADS score determines follow up scanning.  See below for description of categories, and follow up screening recommendations. We will be in touch to schedule your follow up screening annually or based on recommendations of our providers. We will fax a copy of your scan results to your Primary Care Physician, or the physician who referred you to the program, to ensure they have the results. Please call the office if you have any questions or concerns regarding your scanning experience or results.  Our office number is [phone]. Please speak with Denise Phelps, RN. She is our Lung Cancer Screening RN. If she is unavailable when you call, please have the office staff send her a message. She will return your call at her earliest convenience. Remember, if your scan is normal, we will scan you annually as long as you continue to meet the criteria for the program. (Age 55-77, Current smoker or smoker who has quit within the last 15 years). If you are a smoker, remember, quitting is the single most powerful action that you can take to decrease your risk of lung cancer and other pulmonary, breathing related problems. We know quitting is hard, and we are here to help.  Please let us know if there is anything we can do to help you meet your goal of quitting. If you are a former smoker, congratulations. We are proud of you! Remain smoke free! Remember you can refer friends or family members through the number above.  We will screen them to make sure they meet criteria for the program. Thank you for helping us take better care of you by participating in Lung Screening.  Lung RADS Categories:  Lung RADS 1: no nodules  or definitely non-concerning nodules.  Recommendation is for a repeat annual scan in 12 months.  Lung RADS 2:  nodules that are non-concerning in appearance and behavior with a very low likelihood of becoming an active cancer. Recommendation is for a repeat annual scan in 12 months.  Lung RADS 3: nodules that are probably non-concerning , includes nodules with a low likelihood of becoming an active cancer.  Recommendation is for a 6-month repeat screening scan. Often noted after an upper respiratory illness. We will be in touch to make sure you have no questions, and to schedule your 6-month scan.  Lung RADS 4 A: nodules with concerning findings, recommendation is most often for a follow up scan in 3 months or additional testing based on our provider's assessment of the scan. We will be in touch to make sure you have no questions and to schedule the recommended 3 month follow up scan.  Lung RADS 4 B:  indicates findings that are concerning. We will be in touch with you to schedule additional diagnostic testing based on our provider's  assessment of the scan.   

## 2019-05-14 ENCOUNTER — Other Ambulatory Visit: Payer: Self-pay | Admitting: *Deleted

## 2019-05-14 DIAGNOSIS — Z87891 Personal history of nicotine dependence: Secondary | ICD-10-CM

## 2019-05-14 NOTE — Progress Notes (Signed)
Please call patient and let them  know their  low dose Ct was read as a Lung RADS 1, negative study: no nodules or definitely benign nodules. Radiology recommendation is for a repeat LDCT in 12 months. .Please let them  know we will order and schedule their  annual screening scan for 05/2020. Please let them  know there was notation of CAD on their  scan.  Please remind the patient  that this is a non-gated exam therefore degree or severity of disease  cannot be determined. Please have them  follow up with their PCP regarding potential risk factor modification, dietary therapy or pharmacologic therapy if clinically indicated. Pt.  is not  currently on statin therapy. Please place order for annual  screening scan for  05/2020 and fax results to PCP. Thanks so much.  Langley Gauss, please let her know her CT shows age advanced  coronary artery atherosclerosis. Radiology recommends  assessment of coronary risk factors and consideration of medical therapy.Thanks so much

## 2019-05-15 ENCOUNTER — Other Ambulatory Visit: Payer: Self-pay

## 2019-05-15 ENCOUNTER — Encounter (INDEPENDENT_AMBULATORY_CARE_PROVIDER_SITE_OTHER): Payer: Self-pay | Admitting: Family Medicine

## 2019-05-15 ENCOUNTER — Telehealth (INDEPENDENT_AMBULATORY_CARE_PROVIDER_SITE_OTHER): Payer: 59 | Admitting: Family Medicine

## 2019-05-15 DIAGNOSIS — E559 Vitamin D deficiency, unspecified: Secondary | ICD-10-CM | POA: Diagnosis not present

## 2019-05-15 DIAGNOSIS — Z6835 Body mass index (BMI) 35.0-35.9, adult: Secondary | ICD-10-CM

## 2019-05-15 DIAGNOSIS — F3289 Other specified depressive episodes: Secondary | ICD-10-CM | POA: Diagnosis not present

## 2019-05-15 MED ORDER — VITAMIN D (ERGOCALCIFEROL) 1.25 MG (50000 UNIT) PO CAPS: 50000.0000 [IU] | ORAL_CAPSULE | ORAL | 0 refills | Status: DC

## 2019-05-15 MED ORDER — ESCITALOPRAM OXALATE 20 MG PO TABS: 20.0000 mg | ORAL_TABLET | Freq: Every day | ORAL | 0 refills | Status: DC

## 2019-05-15 NOTE — Progress Notes (Signed)
TeleHealth Visit:  Due to the COVID-19 pandemic, this visit was completed with telemedicine (audio/video) technology to reduce patient and provider exposure as well as to preserve personal protective equipment.   Betty Snyder has verbally consented to this TeleHealth visit. The patient is located at home, the provider is located at the [facility] office. The participants in this visit include the listed provider and patient. Betty Snyder was unable to use realtime audiovisual technology today and the telehealth visit was conducted via telephone.   Chief Complaint: OBESITY Betty Snyder is here to discuss her progress with her obesity treatment plan along with follow-up of her obesity related diagnoses. Betty Snyder is on the Category 2 Plan and states she is following her eating plan approximately 60% of the time. Betty Snyder states she is doing 0 minutes 0 times per week.  Today's visit was #: 16 Starting weight: 190 lbs Starting date: 05/08/2018  Interim History: Betty Snyder has struggled more in the last few weeks, and she feels she has likely gained 1-2 lbs. She will be home more often while she looks for a new job, and she will be at risk of boredom/stress eating.  Subjective:   1. Vitamin D deficiency Betty Snyder is stable on Vit D, but her level is not yet at goal. She notes fatigue.  2. Other depression Betty Snyder was laid off her job of 12 years, and she is naturally sad and stressed. She has tolerated Lexapro well and is hopeful of something better coming along.  Assessment/Plan:   1. Vitamin D deficiency Low Vitamin D level contributes to fatigue and are associated with obesity, breast, and colon cancer. We will refill prescription Vitamin D for 1 month. Betty Snyder will follow-up for routine testing of Vitamin D, at least 2-3 times per year to avoid over-replacement.  - Vitamin D, Ergocalciferol, (DRISDOL) 1.25 MG (50000 UNIT) CAPS capsule; Take 1 capsule (50,000 Units total) by mouth every 7 (seven) days.   Dispense: 4 capsule; Refill: 0  2. Other depression Behavior modification techniques were discussed today to help Betty Snyder deal with her emotional/non-hunger eating behaviors. We will refill Lexapro for 1 month. Orders and follow up as documented in patient record.   - escitalopram (LEXAPRO) 20 MG tablet; Take 1 tablet (20 mg total) by mouth daily.  Dispense: 30 tablet; Refill: 0  3. Class 2 severe obesity with serious comorbidity and body mass index (BMI) of 35.0 to 35.9 in adult, unspecified obesity type (HCC) Betty Snyder is currently in the action stage of change. As such, her goal is to continue with weight loss efforts. She has agreed to the Category 3 Plan.    Behavioral modification strategies: emotional eating strategies.  Betty Snyder has agreed to follow-up with our clinic in 4 weeks. She was informed of the importance of frequent follow-up visits to maximize her success with intensive lifestyle modifications for her multiple health conditions.  Objective:   VITALS: Per patient if applicable, see vitals. GENERAL: Alert and in no acute distress. CARDIOPULMONARY: No increased WOB. Speaking in clear sentences.  PSYCH: Pleasant and cooperative. Speech normal rate and Betty Snyder. Affect is appropriate. Insight and judgement are appropriate. Attention is focused, linear, and appropriate.  NEURO: Oriented as arrived to appointment on time with no prompting.   Lab Results  Component Value Date   CREATININE 0.74 02/18/2019   BUN 12 02/18/2019   NA 140 02/18/2019   K 4.4 02/18/2019   CL 103 02/18/2019   CO2 25 02/18/2019   Lab Results  Component Value  Date   ALT 20 02/18/2019   AST 18 02/18/2019   ALKPHOS 79 02/18/2019   BILITOT 0.2 02/18/2019   Lab Results  Component Value Date   HGBA1C 5.7 (H) 02/18/2019   HGBA1C 5.6 10/16/2018   HGBA1C 5.5 05/08/2018   HGBA1C 5.9 11/15/2017   HGBA1C 5.9 02/18/2016   Lab Results  Component Value Date   INSULIN 15.7 02/18/2019   INSULIN 12.0 10/16/2018    INSULIN 8.3 05/08/2018   Lab Results  Component Value Date   TSH 2.620 05/08/2018   Lab Results  Component Value Date   CHOL 247 (H) 02/18/2019   HDL 45 02/18/2019   LDLCALC 163 (H) 02/18/2019   TRIG 210 (H) 02/18/2019   CHOLHDL 5 11/15/2017   Lab Results  Component Value Date   WBC 6.6 11/30/2018   HGB 13.8 11/30/2018   HCT 43.5 11/30/2018   MCV 91.8 11/30/2018   PLT 218 11/30/2018   No results found for: IRON, TIBC, FERRITIN  Attestation Statements:   Reviewed by clinician on day of visit: allergies, medications, problem list, medical history, surgical history, family history, social history, and previous encounter notes.  Time spent on visit including pre-visit chart review and post-visit care was 31 minutes.   I, Trixie Dredge, am acting as transcriptionist for Dennard Nip, MD.  I have reviewed the above documentation for accuracy and completeness, and I agree with the above. - Dennard Nip, MD

## 2019-05-29 ENCOUNTER — Encounter (INDEPENDENT_AMBULATORY_CARE_PROVIDER_SITE_OTHER): Payer: Self-pay | Admitting: Family Medicine

## 2019-05-29 ENCOUNTER — Other Ambulatory Visit (INDEPENDENT_AMBULATORY_CARE_PROVIDER_SITE_OTHER): Payer: Self-pay

## 2019-05-29 DIAGNOSIS — E559 Vitamin D deficiency, unspecified: Secondary | ICD-10-CM

## 2019-05-29 DIAGNOSIS — F3289 Other specified depressive episodes: Secondary | ICD-10-CM

## 2019-05-29 MED ORDER — VITAMIN D (ERGOCALCIFEROL) 1.25 MG (50000 UNIT) PO CAPS: 50000.0000 [IU] | ORAL_CAPSULE | ORAL | 0 refills | Status: DC

## 2019-05-29 MED ORDER — ESCITALOPRAM OXALATE 20 MG PO TABS: 20.0000 mg | ORAL_TABLET | Freq: Every day | ORAL | 0 refills | Status: DC

## 2019-06-12 ENCOUNTER — Telehealth (INDEPENDENT_AMBULATORY_CARE_PROVIDER_SITE_OTHER): Payer: 59 | Admitting: Family Medicine

## 2019-07-12 ENCOUNTER — Ambulatory Visit: Payer: Self-pay | Attending: Internal Medicine

## 2019-07-12 DIAGNOSIS — Z23 Encounter for immunization: Secondary | ICD-10-CM

## 2019-07-12 NOTE — Progress Notes (Signed)
   Covid-19 Vaccination Clinic  Name:  Betty Snyder    MRN: [id number] DOB: 09-20-1965  07/12/2019  [name] was observed post Covid-19 immunization for 15 minutes without incident. She was provided with Vaccine Information Sheet and instruction to access the V-Safe system.   [name] was instructed to call 911 with any severe reactions post vaccine: Marland Kitchen Difficulty breathing  . Swelling of face and throat  . A fast heartbeat  . A bad rash all over body  . Dizziness and weakness   Immunizations Administered    Name Date Dose VIS Date Route   Pfizer COVID-19 Vaccine 07/12/2019  2:27 PM 0.3 mL 03/22/2019 Intramuscular   Manufacturer: [employer], [employer]   Lot: [id number]   [place]: [id number]

## 2019-08-06 ENCOUNTER — Ambulatory Visit: Payer: Self-pay | Attending: Internal Medicine

## 2019-08-06 DIAGNOSIS — Z23 Encounter for immunization: Secondary | ICD-10-CM

## 2019-08-06 NOTE — Progress Notes (Signed)
   Covid-19 Vaccination Clinic  Name:  Betty Snyder    MRN: [id number] DOB: March 20, 1966  08/06/2019  [name] was observed post Covid-19 immunization for 15 minutes without incident. She was provided with Vaccine Information Sheet and instruction to access the V-Safe system.   [name] was instructed to call 911 with any severe reactions post vaccine: Marland Kitchen Difficulty breathing  . Swelling of face and throat  . A fast heartbeat  . A bad rash all over body  . Dizziness and weakness   Immunizations Administered    Name Date Dose VIS Date Route   Pfizer COVID-19 Vaccine 08/06/2019  4:39 PM 0.3 mL 06/05/2018 Intramuscular   Manufacturer: [place]   Lot: [id number]   [place]: [id number]

## 2020-06-03 ENCOUNTER — Other Ambulatory Visit: Payer: Self-pay

## 2020-06-03 ENCOUNTER — Ambulatory Visit (INDEPENDENT_AMBULATORY_CARE_PROVIDER_SITE_OTHER)
Admission: RE | Admit: 2020-06-03 | Discharge: 2020-06-03 | Disposition: A | Discharge status: Home / Self Care | Payer: Managed Care, Other (non HMO) | Source: Ambulatory Visit

## 2020-06-03 DIAGNOSIS — Z87891 Personal history of nicotine dependence: Secondary | ICD-10-CM

## 2020-06-11 ENCOUNTER — Telehealth (INDEPENDENT_AMBULATORY_CARE_PROVIDER_SITE_OTHER): Payer: Managed Care, Other (non HMO) | Admitting: Family Medicine

## 2020-06-11 ENCOUNTER — Encounter: Payer: Self-pay | Admitting: Family Medicine

## 2020-06-11 ENCOUNTER — Other Ambulatory Visit: Payer: Self-pay

## 2020-06-11 DIAGNOSIS — I2510 Atherosclerotic heart disease of native coronary artery without angina pectoris: Secondary | ICD-10-CM | POA: Insufficient documentation

## 2020-06-11 DIAGNOSIS — K760 Fatty (change of) liver, not elsewhere classified: Secondary | ICD-10-CM | POA: Insufficient documentation

## 2020-06-11 DIAGNOSIS — E559 Vitamin D deficiency, unspecified: Secondary | ICD-10-CM

## 2020-06-11 DIAGNOSIS — Z87891 Personal history of nicotine dependence: Secondary | ICD-10-CM

## 2020-06-11 DIAGNOSIS — E7800 Pure hypercholesterolemia, unspecified: Secondary | ICD-10-CM

## 2020-06-11 DIAGNOSIS — R12 Heartburn: Secondary | ICD-10-CM | POA: Insufficient documentation

## 2020-06-11 DIAGNOSIS — R5383 Other fatigue: Secondary | ICD-10-CM | POA: Insufficient documentation

## 2020-06-11 DIAGNOSIS — I700 Atherosclerosis of aorta: Secondary | ICD-10-CM

## 2020-06-11 DIAGNOSIS — E1169 Type 2 diabetes mellitus with other specified complication: Secondary | ICD-10-CM | POA: Insufficient documentation

## 2020-06-11 DIAGNOSIS — E669 Obesity, unspecified: Secondary | ICD-10-CM | POA: Diagnosis not present

## 2020-06-11 DIAGNOSIS — E785 Hyperlipidemia, unspecified: Secondary | ICD-10-CM | POA: Insufficient documentation

## 2020-06-11 DIAGNOSIS — E8881 Metabolic syndrome: Secondary | ICD-10-CM

## 2020-06-11 DIAGNOSIS — F3289 Other specified depressive episodes: Secondary | ICD-10-CM

## 2020-06-11 DIAGNOSIS — R5382 Chronic fatigue, unspecified: Secondary | ICD-10-CM

## 2020-06-11 MED ORDER — ESCITALOPRAM OXALATE 20 MG PO TABS: 20.0000 mg | ORAL_TABLET | Freq: Every day | ORAL | 3 refills | Status: DC

## 2020-06-11 NOTE — Assessment & Plan Note (Signed)
Plans to continue the lung cancer screening program  CT reviewed

## 2020-06-11 NOTE — Assessment & Plan Note (Signed)
D level added to upcoming labs

## 2020-06-11 NOTE — Assessment & Plan Note (Signed)
Discussed how this problem influences overall health and the risks it imposes  Reviewed plan for weight loss with lower calorie diet (via better food choices and also portion control or program like weight watchers) and exercise building up to or more than 30 minutes 5 days per week including some aerobic activity   Pt has lost and gained back -unsure how much Getting more motivated for change now

## 2020-06-11 NOTE — Patient Instructions (Signed)
We will schedule fasting labs and then an in office appointment   For cholesterol Avoid red meat/ fried foods/ egg yolks/ fatty breakfast meats/ butter, cheese and high fat dairy/ and shellfish    For diabetes prevention Try to get most of your carbohydrates from produce (with the exception of white potatoes)  Eat less bread/pasta/rice/snack foods/cereals/sweets and other items from the middle of the grocery store (processed carbs)  Think about adding exercise

## 2020-06-11 NOTE — Assessment & Plan Note (Signed)
Labs ordered incl cbc /tsh  Sedentary job and lack of exercise may be to blame

## 2020-06-11 NOTE — Assessment & Plan Note (Signed)
Incidental finding on CT scan  No symptoms  Hyperlipidemia in the past and smoking hx  Disc risks of this  Planned labs  Will also need to f/u in office for vitals and exam  Will work to minimize vascular dz risk factors

## 2020-06-11 NOTE — Assessment & Plan Note (Signed)
Disc goals for lipids and reasons to control them Rev last labs with pt Rev low sat fat diet in detail Needs another lipid profile-this was ordered  Enc to start watching her diet  May need statin  Will make plan after labs/also needs a f/u in office

## 2020-06-11 NOTE — Assessment & Plan Note (Signed)
A1C ordered with labs Per pt -did loose wt and gain back and habits could be better

## 2020-06-11 NOTE — Assessment & Plan Note (Signed)
Adv f/u for this May need to take H2  Or ppi to control  Disc diet and need for wt loss

## 2020-06-11 NOTE — Assessment & Plan Note (Signed)
Seen on CT No symptoms Labs ordered Disc imp of wt loss/low fat diet for this

## 2020-06-11 NOTE — Assessment & Plan Note (Signed)
Noted incidentally on CT of lungs  Disc imp of controlling lipids /bp /weight and blood sugar  Labs ordered  Needs f/u in office  Consider cardiology ref Has fam h/o vascular dz as well
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
## 2020-06-12 ENCOUNTER — Other Ambulatory Visit (INDEPENDENT_AMBULATORY_CARE_PROVIDER_SITE_OTHER): Payer: Managed Care, Other (non HMO)

## 2020-06-12 ENCOUNTER — Other Ambulatory Visit: Payer: Self-pay

## 2020-06-12 DIAGNOSIS — E559 Vitamin D deficiency, unspecified: Secondary | ICD-10-CM

## 2020-06-12 DIAGNOSIS — R5382 Chronic fatigue, unspecified: Secondary | ICD-10-CM | POA: Diagnosis not present

## 2020-06-12 DIAGNOSIS — E8881 Metabolic syndrome: Secondary | ICD-10-CM | POA: Diagnosis not present

## 2020-06-12 DIAGNOSIS — E7800 Pure hypercholesterolemia, unspecified: Secondary | ICD-10-CM

## 2020-06-12 LAB — COMPREHENSIVE METABOLIC PANEL
ALT: 28 U/L (ref 0–35)
AST: 20 U/L (ref 0–37)
Albumin: 4.2 g/dL (ref 3.5–5.2)
Alkaline Phosphatase: 58 U/L (ref 39–117)
BUN: 12 mg/dL (ref 6–23)
CO2: 27 mEq/L (ref 19–32)
Calcium: 9.3 mg/dL (ref 8.4–10.5)
Chloride: 103 mEq/L (ref 96–112)
Creatinine, Ser: 0.8 mg/dL (ref 0.40–1.20)
GFR: 82.56 mL/min (ref >60.00)
Glucose, Bld: 91 mg/dL (ref 70–99)
Potassium: 4.6 mEq/L (ref 3.5–5.1)
Sodium: 140 mEq/L (ref 135–145)
Total Bilirubin: 0.4 mg/dL (ref 0.2–1.2)
Total Protein: 6.9 g/dL (ref 6.0–8.3)

## 2020-06-12 LAB — CBC WITH DIFFERENTIAL/PLATELET
Basophils Absolute: 0 10*3/uL (ref 0.0–0.1)
Basophils Relative: 0.4 % (ref 0.0–3.0)
Eosinophils Absolute: 0.1 10*3/uL (ref 0.0–0.7)
Eosinophils Relative: 2 % (ref 0.0–5.0)
HCT: 41.6 % (ref 36.0–46.0)
Hemoglobin: 13.9 g/dL (ref 12.0–15.0)
Lymphocytes Relative: 29 % (ref 12.0–46.0)
Lymphs Abs: 2 10*3/uL (ref 0.7–4.0)
MCHC: 33.5 g/dL (ref 30.0–36.0)
MCV: 87.8 fl (ref 78.0–100.0)
Monocytes Absolute: 0.5 10*3/uL (ref 0.1–1.0)
Monocytes Relative: 7 % (ref 3.0–12.0)
Neutro Abs: 4.3 10*3/uL (ref 1.4–7.7)
Neutrophils Relative %: 61.6 % (ref 43.0–77.0)
Platelets: 218 10*3/uL (ref 150.0–400.0)
RBC: 4.74 Mil/uL (ref 3.87–5.11)
RDW: 14 % (ref 11.5–15.5)
WBC: 6.9 10*3/uL (ref 4.0–10.5)

## 2020-06-12 LAB — HEMOGLOBIN A1C: Hgb A1c MFr Bld: 6.1 % (ref 4.6–6.5)

## 2020-06-12 LAB — LDL CHOLESTEROL, DIRECT: Direct LDL: 134 mg/dL

## 2020-06-12 LAB — LIPID PANEL
Cholesterol: 223 mg/dL — ABNORMAL HIGH (ref 0–200)
HDL: 38.2 mg/dL — ABNORMAL LOW (ref >39.00)
NonHDL: 184.46
Total CHOL/HDL Ratio: 6
Triglycerides: 328 mg/dL — ABNORMAL HIGH (ref 0.0–149.0)
VLDL: 65.6 mg/dL — ABNORMAL HIGH (ref 0.0–40.0)

## 2020-06-12 LAB — VITAMIN D 25 HYDROXY (VIT D DEFICIENCY, FRACTURES): VITD: 16.68 ng/mL — ABNORMAL LOW (ref 30.00–100.00)

## 2020-06-12 LAB — TSH: TSH: 2.9 u[IU]/mL (ref 0.35–4.50)

## 2020-06-13 ENCOUNTER — Other Ambulatory Visit (INDEPENDENT_AMBULATORY_CARE_PROVIDER_SITE_OTHER): Payer: Self-pay | Admitting: Family Medicine

## 2020-06-13 DIAGNOSIS — E559 Vitamin D deficiency, unspecified: Secondary | ICD-10-CM

## 2020-06-16 NOTE — Progress Notes (Signed)
Please let patient know there was finding of Hepatic steatosis. This  is a term that describes the build up of fat in the liver. It is normal to have small amounts of fat in your liver, but when the proportion of liver cells that contain fat exceeds more than 5% it is indicative of early stage fatty liver.Treatment often involves reducing risk factors through a diet and exercise plan. It is generally a benign condition, but in a small percentage of patients it does require follow up. Please have the patient follow up with PCP regarding potential risk factor modification, dietary therapy or pharmacologic therapy if clinically indicated.

## 2020-06-16 NOTE — Progress Notes (Signed)
Please call patient and let them  know their  low dose Ct was read as a Lung RADS 1, negative study: no nodules or definitely benign nodules. Radiology recommendation is for a repeat LDCT in 12 months. .Please let them  know we will order and schedule their  annual screening scan for 05/2021. Please let them  know there was notation of CAD on their  scan.  Please remind the patient  that this is a non-gated exam therefore degree or severity of disease  cannot be determined. Please have them  follow up with their PCP regarding potential risk factor modification, dietary therapy or pharmacologic therapy if clinically indicated. Pt.  is not  currently on statin therapy. Please place order for annual  screening scan for  05/2021 and fax results to PCP. Thanks so much.

## 2020-06-17 ENCOUNTER — Telehealth: Payer: Self-pay | Admitting: *Deleted

## 2020-06-17 DIAGNOSIS — E559 Vitamin D deficiency, unspecified: Secondary | ICD-10-CM

## 2020-06-17 MED ORDER — VITAMIN D (ERGOCALCIFEROL) 1.25 MG (50000 UNIT) PO CAPS: 50000.0000 [IU] | ORAL_CAPSULE | ORAL | 0 refills | Status: DC

## 2020-06-17 NOTE — Telephone Encounter (Signed)
Pt notified of lab results and Dr. Marliss Coots comments and instructions. Rx sent to pharmacy and f/u appt has been scheduled

## 2020-06-18 ENCOUNTER — Other Ambulatory Visit (INDEPENDENT_AMBULATORY_CARE_PROVIDER_SITE_OTHER): Payer: Self-pay | Admitting: Family Medicine

## 2020-06-18 ENCOUNTER — Other Ambulatory Visit: Payer: Self-pay | Admitting: *Deleted

## 2020-06-18 DIAGNOSIS — E559 Vitamin D deficiency, unspecified: Secondary | ICD-10-CM

## 2020-06-18 DIAGNOSIS — Z87891 Personal history of nicotine dependence: Secondary | ICD-10-CM

## 2020-06-19 ENCOUNTER — Ambulatory Visit: Payer: Managed Care, Other (non HMO) | Admitting: Family Medicine

## 2020-06-19 ENCOUNTER — Telehealth: Payer: Self-pay

## 2020-06-19 NOTE — Telephone Encounter (Signed)
Relayed Dr. Alba Cory message to patient who verbalized understanding.

## 2020-06-19 NOTE — Telephone Encounter (Signed)
Pt started taking lexapro 20 mg on 06/14/20(pt previously taking lexapro 10 mg) and since increased dose to 20 mg pt has diarrhea and nausea. Pt wants to know if can cut lexapro 20 mg in half for 2 - 4 wks and see how does. Pt said previous provider had started pt on lexapro 10 mg and pt did great. Pt request cb. Pt has FU appt with Dr Glori Bickers on 06/26/20. Sending note to DR [employer] who is out of office and Gentry Fitz NP who is in office and [place] CMA.

## 2020-06-19 NOTE — Telephone Encounter (Signed)
Cutting in 1/2 is just fine. Keep [name] posted and when she needs something sent in I can do that

## 2020-06-22 ENCOUNTER — Other Ambulatory Visit (INDEPENDENT_AMBULATORY_CARE_PROVIDER_SITE_OTHER): Payer: Self-pay | Admitting: Family Medicine

## 2020-06-22 DIAGNOSIS — E559 Vitamin D deficiency, unspecified: Secondary | ICD-10-CM

## 2020-06-26 ENCOUNTER — Ambulatory Visit: Payer: Managed Care, Other (non HMO) | Admitting: Family Medicine

## 2020-06-26 ENCOUNTER — Other Ambulatory Visit: Payer: Self-pay

## 2020-06-26 VITALS — BP 132/80 | HR 59 | Temp 98.2°F | Ht 63.0 in | Wt 223.8 lb

## 2020-06-26 DIAGNOSIS — I2510 Atherosclerotic heart disease of native coronary artery without angina pectoris: Secondary | ICD-10-CM

## 2020-06-26 DIAGNOSIS — E559 Vitamin D deficiency, unspecified: Secondary | ICD-10-CM

## 2020-06-26 DIAGNOSIS — I700 Atherosclerosis of aorta: Secondary | ICD-10-CM | POA: Diagnosis not present

## 2020-06-26 DIAGNOSIS — E7800 Pure hypercholesterolemia, unspecified: Secondary | ICD-10-CM | POA: Diagnosis not present

## 2020-06-26 DIAGNOSIS — E8881 Metabolic syndrome: Secondary | ICD-10-CM | POA: Diagnosis not present

## 2020-06-26 MED ORDER — ATORVASTATIN CALCIUM 10 MG PO TABS: 10.0000 mg | ORAL_TABLET | Freq: Every day | ORAL | 11 refills | Status: DC

## 2020-06-26 NOTE — Progress Notes (Signed)
Patient ID: Betty Snyder, female    DOB: [birth year], 55 y.o.   MRN: [id number]  This visit was conducted in person.  BP 132/80   Pulse (!) 59   Temp 98.2 F (36.8 C) (Temporal)   Ht 5\' 3"  (1.6 m)   Wt 223 lb 12 oz (101.5 kg)   LMP 10/22/2013   SpO2 95%   BMI 39.64 kg/m    CC:  Chief Complaint  Patient presents with  . Follow-up    Labs  and Low CT scan per Dr. Glori Bickers   Subjective:   HPI: Betty Snyder is a 55 y.o. female presenting on 06/26/2020 for Follow-up (Labs  and Low CT scan per Dr. Glori Bickers)  Dr. Glori Bickers not available as planned.. pt rescheduled with me to discuss labs and CT scan.  Vit D def: started on 50,000 units weekly.  Prediabetes :  Lab Results  Component Value Date   HGBA1C 6.1 06/12/2020    Elevated Cholesterol:  Lab Results  Component Value Date   CHOL 223 (H) 06/12/2020   HDL 38.20 (L) 06/12/2020   LDLCALC 163 (H) 02/18/2019   LDLDIRECT 134.0 06/12/2020   TRIG 328.0 (H) 06/12/2020   CHOLHDL 6 06/12/2020  Using medications without problems: Muscle aches:  Diet compliance: moderate Exercise: rare Other complaints: Aortic and coronary atherosclerosis seen  Incidentally on recent lung cancer screening CT... goal LDL < 70.... I reviewed CT in detail with pt. Former smoker  She has noted some swelling in legs.     Relevant past medical, surgical, family and social history reviewed and updated as indicated. Interim medical history since our last visit reviewed. Allergies and medications reviewed and updated. Outpatient Medications Prior to Visit  Medication Sig Dispense Refill  . escitalopram (LEXAPRO) 20 MG tablet Take 1 tablet (20 mg total) by mouth daily. 90 tablet 3  . Vitamin D, Ergocalciferol, (DRISDOL) 1.25 MG (50000 UNIT) CAPS capsule Take 1 capsule (50,000 Units total) by mouth every 7 (seven) days. 12 capsule 0  . b complex vitamins capsule Take 1 capsule by mouth daily. (Patient not taking: Reported on 06/11/2020)    . Biotin 10000  MCG TABS Take 10,000 mcg by mouth daily.  (Patient not taking: Reported on 06/11/2020)     No facility-administered medications prior to visit.     Per HPI unless specifically indicated in ROS section below Review of Systems  Constitutional: Negative for fatigue and fever.  HENT: Negative for congestion.   Eyes: Negative for pain.  Respiratory: Negative for cough and shortness of breath.   Cardiovascular: Positive for leg swelling. Negative for chest pain and palpitations.  Gastrointestinal: Negative for abdominal pain.  Genitourinary: Negative for dysuria and vaginal bleeding.  Musculoskeletal: Negative for back pain.  Neurological: Negative for syncope, light-headedness and headaches.  Psychiatric/Behavioral: Negative for dysphoric mood.   Objective:  BP 132/80   Pulse (!) 59   Temp 98.2 F (36.8 C) (Temporal)   Ht 5\' 3"  (1.6 m)   Wt 223 lb 12 oz (101.5 kg)   LMP 10/22/2013   SpO2 95%   BMI 39.64 kg/m   Wt Readings from Last 3 Encounters:  06/26/20 223 lb 12 oz (101.5 kg)  05/13/19 195 lb 6.4 oz (88.6 kg)  02/25/19 196 lb 1.9 oz (89 kg)      Physical Exam Constitutional:      General: She is not in acute distress.    Appearance: Normal appearance. She is well-developed. She is not  ill-appearing or toxic-appearing.  HENT:     Head: Normocephalic.     Right Ear: Hearing, tympanic membrane, ear canal and external ear normal. Tympanic membrane is not erythematous, retracted or bulging.     Left Ear: Hearing, tympanic membrane, ear canal and external ear normal. Tympanic membrane is not erythematous, retracted or bulging.     Nose: No mucosal edema or rhinorrhea.     Right Sinus: No maxillary sinus tenderness or frontal sinus tenderness.     Left Sinus: No maxillary sinus tenderness or frontal sinus tenderness.     Mouth/Throat:     Pharynx: Uvula midline.  Eyes:     General: Lids are normal. Lids are everted, no foreign bodies appreciated.     Conjunctiva/sclera:  Conjunctivae normal.     Pupils: Pupils are equal, round, and reactive to light.  Neck:     Thyroid: No thyroid mass or thyromegaly.     Vascular: No carotid bruit.     Trachea: Trachea normal.  Cardiovascular:     Rate and Rhythm: Normal rate and regular rhythm.     Pulses: Normal pulses.     Heart sounds: Normal heart sounds, S1 normal and S2 normal. No murmur heard. No friction rub. No gallop.   Pulmonary:     Effort: Pulmonary effort is normal. No tachypnea or respiratory distress.     Breath sounds: Normal breath sounds. No decreased breath sounds, wheezing, rhonchi or rales.  Abdominal:     General: Bowel sounds are normal.     Palpations: Abdomen is soft.     Tenderness: There is no abdominal tenderness.  Musculoskeletal:     Cervical back: Normal range of motion and neck supple.     Right lower leg: 1+ Pitting Edema present.     Left lower leg: 1+ Pitting Edema present.  Skin:    General: Skin is warm and dry.     Findings: No rash.  Neurological:     Mental Status: She is alert.  Psychiatric:        Mood and Affect: Mood is not anxious or depressed.        Speech: Speech normal.        Behavior: Behavior normal. Behavior is cooperative.        Thought Content: Thought content normal.        Judgment: Judgment normal.       Results for orders placed or performed in visit on 06/12/20  VITAMIN D 25 Hydroxy (Vit-D Deficiency, Fractures)  Result Value Ref Range   VITD 16.68 (L) 30.00 - 100.00 ng/mL  TSH  Result Value Ref Range   TSH 2.90 0.35 - 4.50 uIU/mL  Lipid panel  Result Value Ref Range   Cholesterol 223 (H) 0 - 200 mg/dL   Triglycerides 328.0 (H) 0.0 - 149.0 mg/dL   HDL 38.20 (L) >39.00 mg/dL   VLDL 65.6 (H) 0.0 - 40.0 mg/dL   Total CHOL/HDL Ratio 6    NonHDL 184.46   Hemoglobin A1c  Result Value Ref Range   Hgb A1c MFr Bld 6.1 4.6 - 6.5 %  Comprehensive metabolic panel  Result Value Ref Range   Sodium 140 135 - 145 mEq/L   Potassium 4.6 3.5 - 5.1  mEq/L   Chloride 103 96 - 112 mEq/L   CO2 27 19 - 32 mEq/L   Glucose, Bld 91 70 - 99 mg/dL   BUN 12 6 - 23 mg/dL   Creatinine, Ser 0.80 0.40 - 1.20  mg/dL   Total Bilirubin 0.4 0.2 - 1.2 mg/dL   Alkaline Phosphatase 58 39 - 117 U/L   AST 20 0 - 37 U/L   ALT 28 0 - 35 U/L   Total Protein 6.9 6.0 - 8.3 g/dL   Albumin 4.2 3.5 - 5.2 g/dL   GFR 82.56 >60.00 mL/min   Calcium 9.3 8.4 - 10.5 mg/dL  CBC with Differential/Platelet  Result Value Ref Range   WBC 6.9 4.0 - 10.5 K/uL   RBC 4.74 3.87 - 5.11 Mil/uL   Hemoglobin 13.9 12.0 - 15.0 g/dL   HCT 41.6 36.0 - 46.0 %   MCV 87.8 78.0 - 100.0 fl   MCHC 33.5 30.0 - 36.0 g/dL   RDW 14.0 11.5 - 15.5 %   Platelets 218.0 150.0 - 400.0 K/uL   Neutrophils Relative % 61.6 43.0 - 77.0 %   Lymphocytes Relative 29.0 12.0 - 46.0 %   Monocytes Relative 7.0 3.0 - 12.0 %   Eosinophils Relative 2.0 0.0 - 5.0 %   Basophils Relative 0.4 0.0 - 3.0 %   Neutro Abs 4.3 1.4 - 7.7 K/uL   Lymphs Abs 2.0 0.7 - 4.0 K/uL   Monocytes Absolute 0.5 0.1 - 1.0 K/uL   Eosinophils Absolute 0.1 0.0 - 0.7 K/uL   Basophils Absolute 0.0 0.0 - 0.1 K/uL  LDL cholesterol, direct  Result Value Ref Range   Direct LDL 134.0 mg/dL    This visit occurred during the SARS-CoV-2 public health emergency.  Safety protocols were in place, including screening questions prior to the visit, additional usage of staff PPE, and extensive cleaning of exam room while observing appropriate contact time as indicated for disinfecting solutions.   COVID 19 screen:  No recent travel or known exposure to COVID19 The patient denies respiratory symptoms of COVID 19 at this time. The importance of social distancing was discussed today.   Assessment and Plan    Problem List Items Addressed This Visit    Aortic atherosclerosis ([place])    Reviewed necessary risk factor reduction.      Relevant Orders   Ambulatory referral to Cardiology   CAD (coronary artery disease)    Seen on CT Work on low  cholesterol low fat, low carb diet... hear thealthy diet.  We will set up referral to cardiology.  Get back to regular exercise.  Start atorvastatin 10 mg daily.       Hyperlipidemia   Relevant Orders   Ambulatory referral to Cardiology   Insulin resistance    Encouraged exercise, weight loss, healthy eating habits.       Relevant Orders   Ambulatory referral to Cardiology   Vitamin D deficiency - Primary    Replete.       Other Visit Diagnoses    Atherosclerosis of native coronary artery of native heart without angina pectoris       Relevant Orders   Ambulatory referral to Cardiology     Orders Placed This Encounter  Procedures  . Ambulatory referral to Cardiology    Referral Priority:   Routine    Referral Type:   Consultation    Referral Reason:   Specialty Services Required    Requested Specialty:   Cardiology    Number of Visits Requested:   1     Eliezer Lofts, MD

## 2020-06-26 NOTE — Patient Instructions (Addendum)
Work on low cholesterol low fat, low carb diet... hear thealthy diet.  We will set up referral to cardiology.  Get back to regular exercise.  Start atorvastatin 10 mg daily.  Call if any issues.

## 2020-07-31 ENCOUNTER — Other Ambulatory Visit: Payer: Self-pay | Admitting: *Deleted

## 2020-07-31 MED ORDER — ATORVASTATIN CALCIUM 10 MG PO TABS: 10.0000 mg | ORAL_TABLET | Freq: Every day | ORAL | 3 refills | Status: DC

## 2020-08-04 ENCOUNTER — Other Ambulatory Visit: Payer: Self-pay | Admitting: *Deleted

## 2020-08-04 DIAGNOSIS — F3289 Other specified depressive episodes: Secondary | ICD-10-CM

## 2020-08-04 MED ORDER — ESCITALOPRAM OXALATE 20 MG PO TABS: 20.0000 mg | ORAL_TABLET | Freq: Every day | ORAL | 2 refills | Status: DC

## 2020-08-04 NOTE — Telephone Encounter (Signed)
Received fax asking Rx to be sent to mail order pharmacy. done

## 2020-08-09 NOTE — Assessment & Plan Note (Signed)
Replete

## 2020-08-09 NOTE — Assessment & Plan Note (Signed)
Reviewed necessary risk factor reduction.

## 2020-08-09 NOTE — Assessment & Plan Note (Signed)
Seen on CT Work on low cholesterol low fat, low carb diet... hear thealthy diet.  We will set up referral to cardiology.  Get back to regular exercise.  Start atorvastatin 10 mg daily.

## 2020-08-09 NOTE — Assessment & Plan Note (Signed)
Encouraged exercise, weight loss, healthy eating habits. ? ?

## 2020-08-10 ENCOUNTER — Ambulatory Visit: Payer: Managed Care, Other (non HMO) | Admitting: Cardiovascular Disease

## 2020-10-15 ENCOUNTER — Other Ambulatory Visit: Payer: Self-pay | Admitting: Family Medicine

## 2020-10-15 DIAGNOSIS — E559 Vitamin D deficiency, unspecified: Secondary | ICD-10-CM

## 2020-12-15 ENCOUNTER — Encounter: Payer: Self-pay | Admitting: Family Medicine

## 2020-12-30 ENCOUNTER — Ambulatory Visit: Payer: 59 | Admitting: Family Medicine

## 2020-12-30 ENCOUNTER — Other Ambulatory Visit: Payer: Self-pay

## 2020-12-30 ENCOUNTER — Encounter: Payer: Self-pay | Admitting: Family Medicine

## 2020-12-30 VITALS — BP 124/86 | HR 60 | Temp 98.3°F | Ht 63.0 in | Wt 223.0 lb

## 2020-12-30 DIAGNOSIS — E1169 Type 2 diabetes mellitus with other specified complication: Secondary | ICD-10-CM | POA: Insufficient documentation

## 2020-12-30 DIAGNOSIS — I2510 Atherosclerotic heart disease of native coronary artery without angina pectoris: Secondary | ICD-10-CM

## 2020-12-30 DIAGNOSIS — F3289 Other specified depressive episodes: Secondary | ICD-10-CM

## 2020-12-30 DIAGNOSIS — F418 Other specified anxiety disorders: Secondary | ICD-10-CM | POA: Diagnosis not present

## 2020-12-30 DIAGNOSIS — E559 Vitamin D deficiency, unspecified: Secondary | ICD-10-CM

## 2020-12-30 DIAGNOSIS — E7800 Pure hypercholesterolemia, unspecified: Secondary | ICD-10-CM | POA: Diagnosis not present

## 2020-12-30 DIAGNOSIS — R7303 Prediabetes: Secondary | ICD-10-CM

## 2020-12-30 DIAGNOSIS — E669 Obesity, unspecified: Secondary | ICD-10-CM

## 2020-12-30 DIAGNOSIS — Z23 Encounter for immunization: Secondary | ICD-10-CM

## 2020-12-30 DIAGNOSIS — E119 Type 2 diabetes mellitus without complications: Secondary | ICD-10-CM | POA: Insufficient documentation

## 2020-12-30 MED ORDER — ATORVASTATIN CALCIUM 10 MG PO TABS
10.0000 mg | ORAL_TABLET | Freq: Every day | ORAL | 3 refills | Status: DC
Start: 2020-12-30 — End: 2021-01-21

## 2020-12-30 MED ORDER — ESCITALOPRAM OXALATE 20 MG PO TABS: 20.0000 mg | ORAL_TABLET | Freq: Every day | ORAL | 3 refills | Status: DC

## 2020-12-30 NOTE — Assessment & Plan Note (Signed)
Evidence seen on CT of chest -incidental No angina symptoms  Treating lipids with a statin  Working on wt loss Will refer to cardiology for further evaluation

## 2020-12-30 NOTE — Assessment & Plan Note (Signed)
Doing well with lexapro 20 mg daily  Reviewed stressors/ coping techniques/symptoms/ support sources/ tx options and side effects in detail today Getting more motivated for self care
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
## 2020-12-30 NOTE — Assessment & Plan Note (Signed)
Disc goals for lipids and reasons to control them Rev last labs with pt Rev low sat fat diet in detail Pt has evidence of CAD on CT scan Px atorvastatin 10 mg daily-had to stop when she lost ins but now getting back on  Will plan lab in 2 mo for lipids Handout given re: diet

## 2020-12-30 NOTE — Patient Instructions (Addendum)
For cholesterol Avoid red meat/ fried foods/ egg yolks/ fatty breakfast meats/ butter, cheese and high fat dairy/ and shellfish   Get back on the atorvastatin  Let's re check cholesterol in 2 months   Get back on vitamin D3  2000 iu daily   To prevent diabetes Try to get most of your carbohydrates from produce (with the exception of white potatoes)  Eat less bread/pasta/rice/snack foods/cereals/sweets and other items from the middle of the grocery store (processed carbs)  I will place a cardiology referral  You will get a call   Schedule fasting (4 hours) labs for about 2 months

## 2020-12-30 NOTE — Assessment & Plan Note (Signed)
Discussed how this problem influences overall health and the risks it imposes  Reviewed plan for weight loss with lower calorie diet (via better food choices and also portion control or program like weight watchers) and exercise building up to or more than 30 minutes 5 days per week including some aerobic activity   She previously went to a wt loss center and left when she lost insurance

## 2020-12-30 NOTE — Assessment & Plan Note (Signed)
Lab Results  Component Value Date   HGBA1C 6.1 06/12/2020   disc imp of low glycemic diet and wt loss to prevent DM2

## 2020-12-30 NOTE — Assessment & Plan Note (Addendum)
Pt took high dose weekly for 12 weeks Now instructed to take 2000 iu D3 daily otc Will re check level in 2 months  Discussed importance to bone and overall health

## 2021-01-17 ENCOUNTER — Encounter: Payer: Self-pay | Admitting: Family Medicine

## 2021-01-17 DIAGNOSIS — F3289 Other specified depressive episodes: Secondary | ICD-10-CM

## 2021-01-20 NOTE — Telephone Encounter (Signed)
LVM

## 2021-01-20 NOTE — Telephone Encounter (Signed)
Pt returning your call. Pt states that if you would call her between 8:30 and 9:15 am or after 3:00

## 2021-01-21 MED ORDER — ATORVASTATIN CALCIUM 10 MG PO TABS: 10.0000 mg | ORAL_TABLET | Freq: Every day | ORAL | 0 refills | Status: DC

## 2021-01-21 MED ORDER — ESCITALOPRAM OXALATE 20 MG PO TABS: 20.0000 mg | ORAL_TABLET | Freq: Every day | ORAL | 0 refills | Status: DC

## 2021-01-21 NOTE — Telephone Encounter (Signed)
Contacted pt who reports she has still not been able to get her 2 prescriptions. After discussing with the pt what would be the best for her to obtain her medication the quickest she has decided to request a 30 day supply of both medications [facility] [address] and then have the 90 supplies sent to [facility]. Pt explained the front office was rude when she called and she is not happy about being treated like that. I apologized to pt for the way she was treated and assured her this is not how we want our pts taken care of when they call. Thanked pt for bringing this to the managements attention so this could be addressed with the staff so it will not happen to any other pts.  Pt was very appreciative of the call and f/u. Advised pt the [facility] would [facility] by Dr. Glori Bickers since this nurse could not send them in. Advised once the 30 days supply was sent in then another prescription could be sent to [facility] for her refills. Pt was appreciative and will [facility] mediation tomorrow.  Advised if any further problems to contact this nurse at the number provided. Pt verbalized understanding.

## 2021-01-21 NOTE — Telephone Encounter (Signed)
I sent them Please let me know that they got there and I will send to [facility]  Thanks for your help!

## 2021-01-22 MED ORDER — ATORVASTATIN CALCIUM 10 MG PO TABS: 10.0000 mg | ORAL_TABLET | Freq: Every day | ORAL | 1 refills | Status: DC

## 2021-01-22 MED ORDER — ESCITALOPRAM OXALATE 20 MG PO TABS: 20.0000 mg | ORAL_TABLET | Freq: Every day | ORAL | 1 refills | Status: DC

## 2021-01-22 NOTE — Telephone Encounter (Signed)
[facility] and they ave prescriptions ready for pt

## 2021-02-18 ENCOUNTER — Other Ambulatory Visit: Payer: Self-pay | Admitting: Family Medicine

## 2021-02-18 DIAGNOSIS — F3289 Other specified depressive episodes: Secondary | ICD-10-CM

## 2021-03-03 ENCOUNTER — Other Ambulatory Visit: Payer: Self-pay

## 2021-03-03 ENCOUNTER — Telehealth: Payer: Self-pay | Admitting: Family Medicine

## 2021-03-03 ENCOUNTER — Other Ambulatory Visit (INDEPENDENT_AMBULATORY_CARE_PROVIDER_SITE_OTHER): Payer: 59

## 2021-03-03 DIAGNOSIS — E559 Vitamin D deficiency, unspecified: Secondary | ICD-10-CM

## 2021-03-03 DIAGNOSIS — E785 Hyperlipidemia, unspecified: Secondary | ICD-10-CM

## 2021-03-03 DIAGNOSIS — E7800 Pure hypercholesterolemia, unspecified: Secondary | ICD-10-CM

## 2021-03-03 NOTE — Telephone Encounter (Signed)
Plan f/u after lab results are reviewed

## 2021-03-04 LAB — LIPID PANEL
Cholesterol: 157 mg/dL (ref <200)
HDL: 39 mg/dL — ABNORMAL LOW (ref >=50)
LDL Cholesterol (Calc): 82 mg/dL (calc)
Non-HDL Cholesterol (Calc): 118 mg/dL (calc) (ref <130)
Total CHOL/HDL Ratio: 4 (calc) (ref <5.0)
Triglycerides: 298 mg/dL — ABNORMAL HIGH (ref <150)

## 2021-03-04 LAB — ALT: ALT: 33 U/L — ABNORMAL HIGH (ref 6–29)

## 2021-03-04 LAB — VITAMIN D 25 HYDROXY (VIT D DEFICIENCY, FRACTURES): Vit D, 25-Hydroxy: 27 ng/mL — ABNORMAL LOW (ref 30–100)

## 2021-03-04 LAB — AST: AST: 21 U/L (ref 10–35)

## 2021-04-13 ENCOUNTER — Encounter: Payer: Self-pay | Admitting: Internal Medicine

## 2021-04-13 ENCOUNTER — Other Ambulatory Visit: Payer: Self-pay

## 2021-04-13 ENCOUNTER — Ambulatory Visit (INDEPENDENT_AMBULATORY_CARE_PROVIDER_SITE_OTHER): Payer: 59 | Admitting: Internal Medicine

## 2021-04-13 VITALS — BP 130/90 | HR 84 | Ht 61.0 in | Wt 227.0 lb

## 2021-04-13 DIAGNOSIS — E785 Hyperlipidemia, unspecified: Secondary | ICD-10-CM

## 2021-04-13 MED ORDER — ATORVASTATIN CALCIUM 20 MG PO TABS: 20.0000 mg | ORAL_TABLET | Freq: Every day | ORAL | 3 refills | Status: DC
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
## 2021-04-13 NOTE — Patient Instructions (Signed)
Medication Instructions:  INCREASE ATORVASTATIN TO 20mg  ONCE DAILY *If you need a refill on your cardiac medications before your next appointment, please call your pharmacy*  Lab Work:  PLEASE RETURN FOR BLOOD WORK IN 6 WEEKS- NO APPOINTMENT NEEDED. LAB IS OPEN Monday-Friday from 8a-4p.  If you have labs (blood work) drawn today and your tests are completely normal, you will receive your results only by: [place] (if you have MyChart) OR A paper copy in the mail If you have any lab test that is abnormal or we need to change your treatment, we will call you to review the results.  Follow-Up: At [facility], you and your health needs are our priority.  As part of our continuing mission to provide you with exceptional heart care, we have created designated Provider Care Teams.  These Care Teams include your primary Cardiologist (physician) and Advanced Practice Providers (APPs -  Physician Assistants and Nurse Practitioners) who all work together to provide you with the care you need, when you need it.  Your next appointment:   AS NEEDED   The format for your next appointment:   In Person  Provider:   Janina Mayo, MD

## 2021-06-24 ENCOUNTER — Other Ambulatory Visit: Payer: Self-pay | Admitting: Family Medicine

## 2021-06-24 DIAGNOSIS — F3289 Other specified depressive episodes: Secondary | ICD-10-CM

## 2021-07-05 ENCOUNTER — Other Ambulatory Visit: Payer: Self-pay | Admitting: *Deleted

## 2021-07-05 DIAGNOSIS — Z87891 Personal history of nicotine dependence: Secondary | ICD-10-CM

## 2021-07-13 ENCOUNTER — Telehealth: Payer: Self-pay | Admitting: Family Medicine

## 2021-07-13 NOTE — Telephone Encounter (Signed)
Cardiologist refills med not PCP will route to cardiologist office since pt wants med sent to another pharmacy  ?

## 2021-07-13 NOTE — Telephone Encounter (Signed)
?  Encourage patient to contact the pharmacy for refills or they can request refills through [facility] ? ?Did the patient contact the pharmacy:  Y ? ? ?LAST APPOINTMENT DATE:  9.2022 ?NEXT APPOINTMENT DATE: Not scheduled ? ?MEDICATION: atorvastatin (LIPITOR) 20 MG tablet ? ?Is the patient out of medication? Y ? ?If not, how much is left? N ? ?Is this a 90 day supply:  ? ?PHARMACY:   ?[facility] ) - [place], [place] Phone:  [phone]  ?Fax:  [phone]  ?  ? ?Let patient know to contact pharmacy at the end of the day to make sure medication is ready. ? ?Please notify patient to allow 48-72 hours to process ?  ?

## 2021-07-14 ENCOUNTER — Encounter: Payer: Self-pay | Admitting: Internal Medicine

## 2021-07-14 ENCOUNTER — Other Ambulatory Visit: Payer: Self-pay | Admitting: Internal Medicine

## 2021-07-14 DIAGNOSIS — E785 Hyperlipidemia, unspecified: Secondary | ICD-10-CM

## 2021-07-14 MED ORDER — ATORVASTATIN CALCIUM 20 MG PO TABS: 20.0000 mg | ORAL_TABLET | Freq: Every day | ORAL | 3 refills | Status: DC

## 2021-07-16 ENCOUNTER — Ambulatory Visit (INDEPENDENT_AMBULATORY_CARE_PROVIDER_SITE_OTHER)
Admission: RE | Admit: 2021-07-16 | Discharge: 2021-07-16 | Disposition: A | Discharge status: Home / Self Care | Payer: 59 | Source: Ambulatory Visit | Attending: Acute Care | Admitting: Acute Care

## 2021-07-16 DIAGNOSIS — Z87891 Personal history of nicotine dependence: Secondary | ICD-10-CM | POA: Diagnosis not present

## 2021-07-20 ENCOUNTER — Other Ambulatory Visit: Payer: Self-pay

## 2021-07-20 DIAGNOSIS — Z122 Encounter for screening for malignant neoplasm of respiratory organs: Secondary | ICD-10-CM

## 2021-07-20 DIAGNOSIS — Z87891 Personal history of nicotine dependence: Secondary | ICD-10-CM

## 2021-11-17 ENCOUNTER — Encounter (INDEPENDENT_AMBULATORY_CARE_PROVIDER_SITE_OTHER): Payer: Self-pay

## 2021-12-03 ENCOUNTER — Telehealth: Payer: Self-pay | Admitting: Family Medicine

## 2021-12-03 DIAGNOSIS — F3289 Other specified depressive episodes: Secondary | ICD-10-CM

## 2021-12-08 NOTE — Telephone Encounter (Signed)
Pt is due for her CPE or at least a med refill appt sometime in Sept.  Lexapro refilled once, please schedule appt. (Labs prior if possible)

## 2021-12-09 NOTE — Telephone Encounter (Signed)
LVM for patient to call and schedule

## 2021-12-09 NOTE — Telephone Encounter (Signed)
Patient has been scheduled

## 2021-12-21 ENCOUNTER — Telehealth: Payer: Self-pay | Admitting: Family Medicine

## 2021-12-21 DIAGNOSIS — K760 Fatty (change of) liver, not elsewhere classified: Secondary | ICD-10-CM

## 2021-12-21 DIAGNOSIS — R7303 Prediabetes: Secondary | ICD-10-CM

## 2021-12-21 DIAGNOSIS — E7800 Pure hypercholesterolemia, unspecified: Secondary | ICD-10-CM

## 2021-12-21 DIAGNOSIS — E8881 Metabolic syndrome: Secondary | ICD-10-CM

## 2021-12-21 DIAGNOSIS — E559 Vitamin D deficiency, unspecified: Secondary | ICD-10-CM

## 2021-12-21 DIAGNOSIS — Z0000 Encounter for general adult medical examination without abnormal findings: Secondary | ICD-10-CM

## 2021-12-21 DIAGNOSIS — E88819 Insulin resistance, unspecified: Secondary | ICD-10-CM

## 2021-12-21 NOTE — Telephone Encounter (Signed)
-----   Message from Ellamae Sia sent at 12/10/2021 12:40 PM EDT ----- Regarding: Lab orders for Wednesday, 9.13.23 Patient is scheduled for CPX labs, please order future labs, Thanks , Karna Christmas

## 2021-12-22 ENCOUNTER — Other Ambulatory Visit (INDEPENDENT_AMBULATORY_CARE_PROVIDER_SITE_OTHER): Payer: 59

## 2021-12-22 DIAGNOSIS — E7800 Pure hypercholesterolemia, unspecified: Secondary | ICD-10-CM

## 2021-12-22 DIAGNOSIS — R7303 Prediabetes: Secondary | ICD-10-CM

## 2021-12-22 DIAGNOSIS — E559 Vitamin D deficiency, unspecified: Secondary | ICD-10-CM | POA: Diagnosis not present

## 2021-12-22 DIAGNOSIS — K760 Fatty (change of) liver, not elsewhere classified: Secondary | ICD-10-CM

## 2021-12-22 DIAGNOSIS — Z0000 Encounter for general adult medical examination without abnormal findings: Secondary | ICD-10-CM | POA: Diagnosis not present

## 2021-12-22 LAB — COMPREHENSIVE METABOLIC PANEL
ALT: 44 U/L — ABNORMAL HIGH (ref 0–35)
AST: 30 U/L (ref 0–37)
Albumin: 4.3 g/dL (ref 3.5–5.2)
Alkaline Phosphatase: 87 U/L (ref 39–117)
BUN: 14 mg/dL (ref 6–23)
CO2: 25 mEq/L (ref 19–32)
Calcium: 9.4 mg/dL (ref 8.4–10.5)
Chloride: 103 mEq/L (ref 96–112)
Creatinine, Ser: 0.82 mg/dL (ref 0.40–1.20)
GFR: 79.29 mL/min (ref >60.00)
Glucose, Bld: 102 mg/dL — ABNORMAL HIGH (ref 70–99)
Potassium: 4.6 mEq/L (ref 3.5–5.1)
Sodium: 138 mEq/L (ref 135–145)
Total Bilirubin: 0.3 mg/dL (ref 0.2–1.2)
Total Protein: 7.2 g/dL (ref 6.0–8.3)

## 2021-12-22 LAB — LIPID PANEL
Cholesterol: 137 mg/dL (ref 0–200)
HDL: 35.3 mg/dL — ABNORMAL LOW (ref >39.00)
LDL Cholesterol: 72 mg/dL (ref 0–99)
NonHDL: 101.65
Total CHOL/HDL Ratio: 4
Triglycerides: 148 mg/dL (ref 0.0–149.0)
VLDL: 29.6 mg/dL (ref 0.0–40.0)

## 2021-12-22 LAB — CBC WITH DIFFERENTIAL/PLATELET
Basophils Absolute: 0 10*3/uL (ref 0.0–0.1)
Basophils Relative: 0.5 % (ref 0.0–3.0)
Eosinophils Absolute: 0.1 10*3/uL (ref 0.0–0.7)
Eosinophils Relative: 1.6 % (ref 0.0–5.0)
HCT: 42.9 % (ref 36.0–46.0)
Hemoglobin: 14.3 g/dL (ref 12.0–15.0)
Lymphocytes Relative: 25.1 % (ref 12.0–46.0)
Lymphs Abs: 1.9 10*3/uL (ref 0.7–4.0)
MCHC: 33.3 g/dL (ref 30.0–36.0)
MCV: 89.3 fl (ref 78.0–100.0)
Monocytes Absolute: 0.4 10*3/uL (ref 0.1–1.0)
Monocytes Relative: 5.7 % (ref 3.0–12.0)
Neutro Abs: 5.1 10*3/uL (ref 1.4–7.7)
Neutrophils Relative %: 67.1 % (ref 43.0–77.0)
Platelets: 244 10*3/uL (ref 150.0–400.0)
RBC: 4.8 Mil/uL (ref 3.87–5.11)
RDW: 13.8 % (ref 11.5–15.5)
WBC: 7.6 10*3/uL (ref 4.0–10.5)

## 2021-12-22 LAB — VITAMIN D 25 HYDROXY (VIT D DEFICIENCY, FRACTURES): VITD: 39.06 ng/mL (ref 30.00–100.00)

## 2021-12-22 LAB — TSH: TSH: 2.86 u[IU]/mL (ref 0.35–5.50)

## 2021-12-22 LAB — HEMOGLOBIN A1C: Hgb A1c MFr Bld: 6.6 % — ABNORMAL HIGH (ref 4.6–6.5)

## 2021-12-29 ENCOUNTER — Ambulatory Visit (INDEPENDENT_AMBULATORY_CARE_PROVIDER_SITE_OTHER): Payer: 59 | Admitting: Family Medicine

## 2021-12-29 ENCOUNTER — Telehealth: Payer: Self-pay | Admitting: Family Medicine

## 2021-12-29 ENCOUNTER — Encounter: Payer: Self-pay | Admitting: Family Medicine

## 2021-12-29 VITALS — BP 112/78 | HR 74 | Temp 98.0°F | Ht 61.5 in | Wt 228.2 lb

## 2021-12-29 DIAGNOSIS — Z23 Encounter for immunization: Secondary | ICD-10-CM

## 2021-12-29 DIAGNOSIS — M722 Plantar fascial fibromatosis: Secondary | ICD-10-CM

## 2021-12-29 DIAGNOSIS — E559 Vitamin D deficiency, unspecified: Secondary | ICD-10-CM

## 2021-12-29 DIAGNOSIS — Z0000 Encounter for general adult medical examination without abnormal findings: Secondary | ICD-10-CM | POA: Diagnosis not present

## 2021-12-29 DIAGNOSIS — F418 Other specified anxiety disorders: Secondary | ICD-10-CM | POA: Diagnosis not present

## 2021-12-29 DIAGNOSIS — E7800 Pure hypercholesterolemia, unspecified: Secondary | ICD-10-CM

## 2021-12-29 DIAGNOSIS — K219 Gastro-esophageal reflux disease without esophagitis: Secondary | ICD-10-CM

## 2021-12-29 DIAGNOSIS — I700 Atherosclerosis of aorta: Secondary | ICD-10-CM

## 2021-12-29 DIAGNOSIS — I2510 Atherosclerotic heart disease of native coronary artery without angina pectoris: Secondary | ICD-10-CM

## 2021-12-29 DIAGNOSIS — K760 Fatty (change of) liver, not elsewhere classified: Secondary | ICD-10-CM

## 2021-12-29 DIAGNOSIS — Z87891 Personal history of nicotine dependence: Secondary | ICD-10-CM

## 2021-12-29 DIAGNOSIS — R400 Somnolence: Secondary | ICD-10-CM

## 2021-12-29 DIAGNOSIS — E119 Type 2 diabetes mellitus without complications: Secondary | ICD-10-CM

## 2021-12-29 DIAGNOSIS — Z1231 Encounter for screening mammogram for malignant neoplasm of breast: Secondary | ICD-10-CM

## 2021-12-29 MED ORDER — FAMOTIDINE 20 MG PO TABS: 20.0000 mg | ORAL_TABLET | Freq: Two times a day (BID) | ORAL | 3 refills | Status: DC

## 2021-12-29 NOTE — Assessment & Plan Note (Signed)
With snoring Pt declines further eval because she is not open to cpap I urged her to reconsider due to risks inv with sleep apnea as well as quality of life decrease Urged her to work on wt loss

## 2021-12-29 NOTE — Assessment & Plan Note (Signed)
Taking 5000 iu D3 otc daily  Vitamin D level is therapeutic with current supplementation Disc importance of this to bone and overall health

## 2021-12-29 NOTE — Assessment & Plan Note (Signed)
Disc goals for lipids and reasons to control them Rev last labs with pt Rev low sat fat diet in detail In setting of CAD Now tolerating atorvastatin 20  Goal LDL of 70  (needs to come down 2 pt) Trig down  HDL low-needs exercise  Will work on diet

## 2021-12-29 NOTE — Assessment & Plan Note (Signed)
Pt req referral to podiatry  Worse in R foot

## 2021-12-29 NOTE — Assessment & Plan Note (Signed)
Now a1c is in the diabetes range  Lab Results  Component Value Date   HGBA1C 6.6 (H) 12/22/2021   disc imp of low glycemic diet and wt loss to prevent end organ damage  Ref to DM teaching  F/u 3 mo to discuss in more detail  Enc good foot care and annual eye exam Declines metformin today  Handouts given

## 2021-12-29 NOTE — Assessment & Plan Note (Signed)
Reviewed health habits including diet and exercise and skin cancer prevention Reviewed appropriate screening tests for age  Also reviewed health mt list, fam hx and immunization status , as well as social and family history   Flu shot today  Plans to check on coverage of shingrix  Pap due 02/2023 (had hysterectomy- if at all) Strongly enc her to consider eval for sleep apnea  Mammogram ordered-pt iven # to schedule Colonoscopy 2019 with 10 y recall per pt

## 2021-12-29 NOTE — Assessment & Plan Note (Signed)
Continues lexapro Strongly enc exercise program and eval for sleep apnea

## 2021-12-29 NOTE — Assessment & Plan Note (Signed)
Discussed how this problem influences overall health and the risks it imposes  Reviewed plan for weight loss with lower calorie diet (via better food choices and also portion control or program like weight watchers) and exercise building up to or more than 30 minutes 5 days per week including some aerobic activity    
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
## 2021-12-29 NOTE — Assessment & Plan Note (Signed)
Reviewed CT scan  Is now on higher dose of statin Disc lifestyle change

## 2021-12-29 NOTE — Assessment & Plan Note (Signed)
Mammogram ordered °Pt will call to schedule  °

## 2021-12-29 NOTE — Assessment & Plan Note (Signed)
In CT lung cancer screening program Quit over 48 y ago Next CT due -? feb

## 2021-12-29 NOTE — Telephone Encounter (Signed)
Error

## 2021-12-29 NOTE — Assessment & Plan Note (Signed)
Now statin dose is increased Disc diet/lifestyle change

## 2021-12-29 NOTE — Assessment & Plan Note (Signed)
ALT is 44 Watching  Now on inc dose statin as well  Strongly enc wt loss with diet and exercise

## 2021-12-29 NOTE — Patient Instructions (Addendum)
I will place a podiatry referral  If you don't get a call in 1-2 weeks let us know   Flu shot today   If you are interested in the shingles vaccine series (Shingrix), call your insurance or pharmacy to check on coverage and location it must be given.  If affordable - you can schedule it here or at your pharmacy depending on coverage   If you don't hear from anyone a month before your CT is due let us know   Consider a sleep apnea evaluation in the future    For cholesterol  Avoid red meat/ fried foods/ egg yolks/ fatty breakfast meats/ butter, cheese and high fat dairy/ and shellfish   Your a1c is in the diabetic range now  I will work on a referral to diabetic teaching in [employer]  If you don't get a call in 1-2 weeks let us know   Follow up in 3 months   Try to get most of your carbohydrates from produce (with the exception of white potatoes)  Eat less bread/pasta/rice/snack foods/cereals/sweets and other items from the middle of the grocery store (processed carbs)   Think about an exercise program- something to get heart rate up and something to build muscle   Call and schedule your mammogram  Please call the location of your choice from the menu below to schedule your Mammogram and/or Bone Density appointment.    [facility]                      Phone:  [phone], [place] [address]                                                             Services: Traditional and 3D Mammogram, [facility]                 Phone: [phone] [address], [place] [address]    Service: Bone Density ONLY   *this site does NOT perform mammograms  [facility]                        Phone:  [phone] [address]                                   [place], [place] [address]                                            Services:  3D Mammogram and Bone Density    [place] [facility]   Phone:  [phone]   [place], [place] [address]                                            Services: 3D Mammogram and [place]  [place] at [facility])  Phone:  [phone]   [address] Room [place], [place] [address]                                              Services:  3D Mammogram and Bone Density

## 2021-12-29 NOTE — Assessment & Plan Note (Signed)
Much more heartburn daily  pepcid 20 mg bid sent to pharmacy   Enc her to consider wt loss

## 2022-01-18 ENCOUNTER — Ambulatory Visit: Payer: 59 | Admitting: Podiatry

## 2022-01-31 ENCOUNTER — Ambulatory Visit: Payer: 59 | Admitting: Dietician

## 2022-02-24 ENCOUNTER — Other Ambulatory Visit: Payer: Self-pay | Admitting: Family Medicine

## 2022-02-24 DIAGNOSIS — F3289 Other specified depressive episodes: Secondary | ICD-10-CM

## 2022-03-01 ENCOUNTER — Ambulatory Visit
Admission: RE | Admit: 2022-03-01 | Discharge: 2022-03-01 | Disposition: A | Discharge status: Home / Self Care | Payer: 59 | Source: Ambulatory Visit | Attending: Family Medicine | Admitting: Family Medicine

## 2022-03-01 DIAGNOSIS — Z1231 Encounter for screening mammogram for malignant neoplasm of breast: Secondary | ICD-10-CM

## 2022-03-22 ENCOUNTER — Telehealth: Payer: Self-pay | Admitting: Family Medicine

## 2022-03-22 DIAGNOSIS — E119 Type 2 diabetes mellitus without complications: Secondary | ICD-10-CM

## 2022-03-22 DIAGNOSIS — E1169 Type 2 diabetes mellitus with other specified complication: Secondary | ICD-10-CM

## 2022-03-22 NOTE — Telephone Encounter (Signed)
-----   Message from Velna Hatchet, RT sent at 03/07/2022 10:15 AM EST ----- Regarding: Wed 12/13 lab Lab orders needed for appt on 12/13.  Thanks, Anda Kraft

## 2022-03-23 ENCOUNTER — Encounter: Payer: Self-pay | Admitting: Family Medicine

## 2022-03-23 ENCOUNTER — Other Ambulatory Visit: Payer: 59

## 2022-03-24 NOTE — Telephone Encounter (Signed)
I spoke with pt; pt said symptoms started on 03/20/22 and tested + covid at home on 03/23/22. Pt is 2, diabetic,CAD and obese. Pt has dry and prod cough with clear phlegm, pt has chest congestion but no CP. Pt has had some SOB upon exertion. Pt said today temp 100.1 no S/T and no H/A today. Pt had H/A earlier in the week. No dizziness or vision changes. Pt said she is in no distress but pt said her employer said pt would have to have a neg covid test before returning to work., no available appts at [facility] or couple other [facility] offices and pt said she does not want to go to [facility] and wait. Pt scheduled VV with Dr Glori Bickers on 03/25/22 at 8 AM. Pt encouraged to drink plenty of fluids, rest and self quarantine for at least 5 days. Sending note to Dr Glori Bickers and [employer].

## 2022-03-24 NOTE — Telephone Encounter (Signed)
Please triage

## 2022-03-25 ENCOUNTER — Telehealth (INDEPENDENT_AMBULATORY_CARE_PROVIDER_SITE_OTHER): Payer: 59 | Admitting: Family Medicine

## 2022-03-25 ENCOUNTER — Encounter: Payer: Self-pay | Admitting: Family Medicine

## 2022-03-25 VITALS — Temp 98.7°F

## 2022-03-25 DIAGNOSIS — U071 COVID-19: Secondary | ICD-10-CM | POA: Insufficient documentation

## 2022-03-25 NOTE — Assessment & Plan Note (Signed)
Mild to moderate, day 6 and symptoms are improving Disc symptom care and ER precautions  No wheeze or sob   Rev isolation protocol- will return to work when symptoms are better and then mask for an addl 10 days   Update if not starting to improve in a week or if worsening

## 2022-03-25 NOTE — Patient Instructions (Addendum)
Drink fluids and rest  mucinex DM is good for cough and congestion if needed  Nasal saline for congestion as needed  Tylenol for fever or pain or headache  Please alert [name] if symptoms worsen (if severe or short of breath please go to the ER)   Continue to isolate until symptoms are mostly better  Then mask for an additional 10 days   Update if not starting to improve in a week or if worsening
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
## 2022-03-27 ENCOUNTER — Telehealth: Payer: Self-pay | Admitting: Family Medicine

## 2022-03-27 DIAGNOSIS — E1169 Type 2 diabetes mellitus with other specified complication: Secondary | ICD-10-CM

## 2022-03-27 DIAGNOSIS — E119 Type 2 diabetes mellitus without complications: Secondary | ICD-10-CM

## 2022-03-27 NOTE — Telephone Encounter (Signed)
-----   Message from Velna Hatchet, RT sent at 03/22/2022 11:19 AM EST ----- Regarding: Mon 12/18 lab Lab orders needed for labs only appt on 12/18, please.  Thanks, Anda Kraft

## 2022-03-28 ENCOUNTER — Other Ambulatory Visit: Payer: 59

## 2022-03-30 ENCOUNTER — Ambulatory Visit: Payer: 59 | Admitting: Family Medicine

## 2022-04-12 ENCOUNTER — Other Ambulatory Visit (INDEPENDENT_AMBULATORY_CARE_PROVIDER_SITE_OTHER): Payer: 59

## 2022-04-12 DIAGNOSIS — E119 Type 2 diabetes mellitus without complications: Secondary | ICD-10-CM | POA: Diagnosis not present

## 2022-04-12 DIAGNOSIS — E1169 Type 2 diabetes mellitus with other specified complication: Secondary | ICD-10-CM | POA: Diagnosis not present

## 2022-04-12 DIAGNOSIS — E785 Hyperlipidemia, unspecified: Secondary | ICD-10-CM

## 2022-04-12 LAB — COMPREHENSIVE METABOLIC PANEL
ALT: 41 U/L — ABNORMAL HIGH (ref 0–35)
AST: 25 U/L (ref 0–37)
Albumin: 4.4 g/dL (ref 3.5–5.2)
Alkaline Phosphatase: 68 U/L (ref 39–117)
BUN: 15 mg/dL (ref 6–23)
CO2: 27 mEq/L (ref 19–32)
Calcium: 9.4 mg/dL (ref 8.4–10.5)
Chloride: 102 mEq/L (ref 96–112)
Creatinine, Ser: 0.88 mg/dL (ref 0.40–1.20)
GFR: 72.69 mL/min (ref >60.00)
Glucose, Bld: 102 mg/dL — ABNORMAL HIGH (ref 70–99)
Potassium: 4.5 mEq/L (ref 3.5–5.1)
Sodium: 138 mEq/L (ref 135–145)
Total Bilirubin: 0.5 mg/dL (ref 0.2–1.2)
Total Protein: 7.1 g/dL (ref 6.0–8.3)

## 2022-04-12 LAB — LDL CHOLESTEROL, DIRECT: Direct LDL: 73 mg/dL

## 2022-04-12 LAB — LIPID PANEL
Cholesterol: 141 mg/dL (ref 0–200)
HDL: 38.7 mg/dL — ABNORMAL LOW (ref >39.00)
NonHDL: 102.52
Total CHOL/HDL Ratio: 4
Triglycerides: 208 mg/dL — ABNORMAL HIGH (ref 0.0–149.0)
VLDL: 41.6 mg/dL — ABNORMAL HIGH (ref 0.0–40.0)

## 2022-04-12 LAB — HEMOGLOBIN A1C: Hgb A1c MFr Bld: 6.6 % — ABNORMAL HIGH (ref 4.6–6.5)

## 2022-04-15 ENCOUNTER — Ambulatory Visit: Payer: 59 | Admitting: Family Medicine

## 2022-04-19 ENCOUNTER — Ambulatory Visit: Payer: 59 | Admitting: Family Medicine

## 2022-04-25 ENCOUNTER — Ambulatory Visit: Payer: 59 | Admitting: Family Medicine

## 2022-04-27 ENCOUNTER — Ambulatory Visit: Payer: 59 | Admitting: Family Medicine

## 2022-04-27 ENCOUNTER — Encounter: Payer: Self-pay | Admitting: Family Medicine

## 2022-04-27 VITALS — BP 104/70 | HR 74 | Temp 97.5°F | Resp 16 | Ht 61.5 in | Wt 229.4 lb

## 2022-04-27 DIAGNOSIS — E119 Type 2 diabetes mellitus without complications: Secondary | ICD-10-CM

## 2022-04-27 DIAGNOSIS — E1169 Type 2 diabetes mellitus with other specified complication: Secondary | ICD-10-CM

## 2022-04-27 DIAGNOSIS — K219 Gastro-esophageal reflux disease without esophagitis: Secondary | ICD-10-CM | POA: Diagnosis not present

## 2022-04-27 DIAGNOSIS — E785 Hyperlipidemia, unspecified: Secondary | ICD-10-CM

## 2022-04-27 MED ORDER — FAMOTIDINE 20 MG PO TABS: 20.0000 mg | ORAL_TABLET | Freq: Two times a day (BID) | ORAL | 0 refills | Status: DC

## 2022-04-27 NOTE — Assessment & Plan Note (Signed)
Disc goals for lipids and reasons to control them Rev last labs with pt Rev low sat fat diet in detail Close to goal with atorvastatin 20 mg daily

## 2022-04-27 NOTE — Assessment & Plan Note (Signed)
Lab Results  Component Value Date   HGBA1C 6.6 (H) 04/12/2022   This is unchanged Pt declines medication/metformin  Has not made any lifestyle changes yet  Aware wt loss would help  Did not go to dm ed due to lack of time and ? About ins coverege  Takes statin Will do microalb next time (lab was closed today)  Inst her to schedule eye exam  Reviewed goals for low glycemic diet and exercise  F/u in 3 mo

## 2022-04-27 NOTE — Assessment & Plan Note (Signed)
Pt has not been motivated to change habits yet  Is now diabetic  Did well with the healthy wt and wellness clinic in the past-considering a return Not interested in exercise outside the home, discussed options   Disc low glycemic diet goals
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
## 2022-04-27 NOTE — Patient Instructions (Addendum)
You need an alternative for indoor exercise   Videos for home are a good option  Chair yoga would be a good start  An exercise bike may work well also   For diabetes  Try to get most of your carbohydrates from produce (with the exception of white potatoes)  Eat less bread/pasta/rice/snack foods/cereals/sweets and other items from the middle of the grocery store (processed carbs)  Weight watchers is worth a look / the diabetic version   I'm happy to get you back to [facility] with [facility]  Let me know if /when you want a referral   Work on incorporating more healthy foods    Get an appt for a diabetic eye exam   Let's do your annual urine protein test today   Go back on pepcid 20 mg twice daily for 2-3 months  Work on weight loss If that does not help acid in your throat let me know   Follow up in 3 months

## 2022-04-27 NOTE — Assessment & Plan Note (Signed)
This has been worse with wt gain  Disc imp of treatment to control symptoms while working on wt loss   Px pepcid 20 mg bid  Will try for 2-3 mo  Update if not improved  F/u 3 mo   Disc dietary changes that may help

## 2022-04-28 NOTE — Addendum Note (Signed)
Addended by: Ellamae Sia on: 04/28/2022 08:22 AM   Modules accepted: Orders

## 2022-05-19 ENCOUNTER — Other Ambulatory Visit: Payer: Self-pay | Admitting: Internal Medicine

## 2022-05-19 DIAGNOSIS — E785 Hyperlipidemia, unspecified: Secondary | ICD-10-CM

## 2022-06-07 ENCOUNTER — Ambulatory Visit: Payer: 59 | Admitting: Primary Care

## 2022-06-07 ENCOUNTER — Encounter: Payer: Self-pay | Admitting: Primary Care

## 2022-06-07 VITALS — BP 132/80 | HR 70 | Temp 99.9°F | Ht 61.5 in | Wt 229.0 lb

## 2022-06-07 DIAGNOSIS — H1033 Unspecified acute conjunctivitis, bilateral: Secondary | ICD-10-CM | POA: Diagnosis not present

## 2022-06-07 MED ORDER — POLYMYXIN B-TRIMETHOPRIM 10000-0.1 UNIT/ML-% OP SOLN: 1.0000 [drp] | Freq: Four times a day (QID) | OPHTHALMIC | 0 refills | Status: DC

## 2022-06-07 NOTE — Patient Instructions (Addendum)
Use the trimethoprim-polymyxin 1 drop in both eyes every 6 hoursfor 5-7 days.   Please follow up with PCP if symptoms do not improve or worsen.   It was a pleasure to see you today!
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
## 2022-06-07 NOTE — Assessment & Plan Note (Addendum)
Differentials include bacterial vs viral conjunctivitis.   Discussed that symptoms could be viral, but will trial antibiotic therapy based on presentation.    Start trimethoprim-polymyxin b 1 drop every 6 hours for 5-7 days.   She will follow up with PCP if symptoms do not improve or worsen.   I evaluated patient, was consulted regarding treatment, and agree with assessment and plan per Tinnie Gens, RN, DNP student.   Allie Bossier, NP-C
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
## 2022-07-20 ENCOUNTER — Ambulatory Visit
Admission: RE | Admit: 2022-07-20 | Discharge: 2022-07-20 | Disposition: A | Discharge status: Home / Self Care | Payer: 59 | Source: Ambulatory Visit | Attending: Acute Care | Admitting: Acute Care

## 2022-07-20 DIAGNOSIS — Z87891 Personal history of nicotine dependence: Secondary | ICD-10-CM

## 2022-07-20 DIAGNOSIS — Z122 Encounter for screening for malignant neoplasm of respiratory organs: Secondary | ICD-10-CM | POA: Insufficient documentation

## 2022-07-22 ENCOUNTER — Other Ambulatory Visit: Payer: Self-pay | Admitting: Acute Care

## 2022-07-22 DIAGNOSIS — Z122 Encounter for screening for malignant neoplasm of respiratory organs: Secondary | ICD-10-CM

## 2022-07-22 DIAGNOSIS — Z87891 Personal history of nicotine dependence: Secondary | ICD-10-CM

## 2022-07-28 ENCOUNTER — Telehealth: Payer: Self-pay | Admitting: *Deleted

## 2022-07-28 NOTE — Telephone Encounter (Signed)
Left VM requesting pt to call the office back 

## 2022-07-28 NOTE — Telephone Encounter (Signed)
-----   Message from Judy Pimple, MD sent at 07/27/2022  8:19 PM EDT ----- Looks like no lung changes on CT  Reassuring  Annual screening recommended   Unsure if she also gets these results from someone else in screening team Thanks  ----- Message ----- From: Jenna Luo, RN Sent: 07/22/2022   8:02 AM EDT To: Judy Pimple, MD

## 2022-08-03 ENCOUNTER — Other Ambulatory Visit: Payer: Self-pay | Admitting: Family Medicine

## 2022-08-03 DIAGNOSIS — F3289 Other specified depressive episodes: Secondary | ICD-10-CM

## 2023-05-22 ENCOUNTER — Ambulatory Visit: Payer: 59 | Admitting: Family Medicine

## 2023-05-22 VITALS — BP 126/86 | HR 83 | Temp 98.3°F | Ht 61.5 in | Wt 233.0 lb

## 2023-05-22 DIAGNOSIS — E7800 Pure hypercholesterolemia, unspecified: Secondary | ICD-10-CM | POA: Diagnosis not present

## 2023-05-22 DIAGNOSIS — F418 Other specified anxiety disorders: Secondary | ICD-10-CM | POA: Diagnosis not present

## 2023-05-22 DIAGNOSIS — Z1231 Encounter for screening mammogram for malignant neoplasm of breast: Secondary | ICD-10-CM

## 2023-05-22 DIAGNOSIS — Z0000 Encounter for general adult medical examination without abnormal findings: Secondary | ICD-10-CM | POA: Diagnosis not present

## 2023-05-22 DIAGNOSIS — E559 Vitamin D deficiency, unspecified: Secondary | ICD-10-CM

## 2023-05-22 DIAGNOSIS — E785 Hyperlipidemia, unspecified: Secondary | ICD-10-CM

## 2023-05-22 DIAGNOSIS — E119 Type 2 diabetes mellitus without complications: Secondary | ICD-10-CM

## 2023-05-22 DIAGNOSIS — K760 Fatty (change of) liver, not elsewhere classified: Secondary | ICD-10-CM

## 2023-05-22 DIAGNOSIS — I700 Atherosclerosis of aorta: Secondary | ICD-10-CM

## 2023-05-22 DIAGNOSIS — Z114 Encounter for screening for human immunodeficiency virus [HIV]: Secondary | ICD-10-CM

## 2023-05-22 DIAGNOSIS — E1169 Type 2 diabetes mellitus with other specified complication: Secondary | ICD-10-CM | POA: Diagnosis not present

## 2023-05-22 DIAGNOSIS — Z1159 Encounter for screening for other viral diseases: Secondary | ICD-10-CM

## 2023-05-22 LAB — COMPREHENSIVE METABOLIC PANEL
ALT: 32 U/L (ref 0–35)
AST: 21 U/L (ref 0–37)
Albumin: 4.3 g/dL (ref 3.5–5.2)
Alkaline Phosphatase: 84 U/L (ref 39–117)
BUN: 17 mg/dL (ref 6–23)
CO2: 28 meq/L (ref 19–32)
Calcium: 9.8 mg/dL (ref 8.4–10.5)
Chloride: 102 meq/L (ref 96–112)
Creatinine, Ser: 0.63 mg/dL (ref 0.40–1.20)
GFR: 97.37 mL/min (ref >60.00)
Glucose, Bld: 131 mg/dL — ABNORMAL HIGH (ref 70–99)
Potassium: 4.6 meq/L (ref 3.5–5.1)
Sodium: 139 meq/L (ref 135–145)
Total Bilirubin: 0.3 mg/dL (ref 0.2–1.2)
Total Protein: 7.1 g/dL (ref 6.0–8.3)

## 2023-05-22 LAB — LIPID PANEL
Cholesterol: 192 mg/dL (ref 0–200)
HDL: 40.7 mg/dL (ref >39.00)
LDL Cholesterol: 74 mg/dL (ref 0–99)
NonHDL: 151.3
Total CHOL/HDL Ratio: 5
Triglycerides: 388 mg/dL — ABNORMAL HIGH (ref 0.0–149.0)
VLDL: 77.6 mg/dL — ABNORMAL HIGH (ref 0.0–40.0)

## 2023-05-22 LAB — HEMOGLOBIN A1C: Hgb A1c MFr Bld: 6.8 % — ABNORMAL HIGH (ref 4.6–6.5)

## 2023-05-22 LAB — TSH: TSH: 1.91 u[IU]/mL (ref 0.35–5.50)

## 2023-05-22 LAB — LDL CHOLESTEROL, DIRECT: Direct LDL: 118 mg/dL

## 2023-05-22 LAB — VITAMIN D 25 HYDROXY (VIT D DEFICIENCY, FRACTURES): VITD: 25.64 ng/mL — ABNORMAL LOW (ref 30.00–100.00)

## 2023-05-22 NOTE — Assessment & Plan Note (Signed)
 Aiming for good cholesterol and blood pressure control  No symptoms

## 2023-05-22 NOTE — Assessment & Plan Note (Signed)
Disc goals for lipids and reasons to control them Rev last labs with pt Rev low sat fat diet in detail Labs today  Continues atorvastatin 20 mg daily 

## 2023-05-22 NOTE — Assessment & Plan Note (Signed)
 Discussed how this problem influences overall health and the risks it imposes  Reviewed plan for weight loss with lower calorie diet (via better food choices (lower glycemic and portion control) along with exercise building up to or more than 30 minutes 5 days per week including some aerobic activity and strength training

## 2023-05-22 NOTE — Assessment & Plan Note (Signed)
Mammogram ordered °Pt will call to schedule  °

## 2023-05-22 NOTE — Assessment & Plan Note (Signed)
 Continues lexapro  20 mg daily / wants to stay on this More anxious with stressors but feels she is doing ok   Counseling may be option in future

## 2023-05-22 NOTE — Assessment & Plan Note (Signed)
 Lab today for A1c and microalb  Has declined medication in past  Not watching diet and ins did not cover dm teaching   Mediterranean diet may be helpful   Encouraged to schedule annual eye exam

## 2023-05-22 NOTE — Patient Instructions (Addendum)
 Get your 2nd shingles vaccine when you can   Change your address with your pharmacy - after that let us  know and we will refill medicines   Consider exercise  Stay active Add some strength training to your routine, this is important for bone and brain health and can reduce your risk of falls and help your body use insulin  properly and regulate weight  Light weights, exercise bands , and internet videos are a good way to start  Yoga (chair or regular), machines , floor exercises or a gym with machines are also good options     Try to get most of your carbohydrates from produce (with the exception of white potatoes) and whole grains Eat less bread/pasta/rice/snack foods/cereals/sweets and other items from the middle of the grocery store (processed carbs)  Labs today   Call and schedule your diabetic eye exam and have it sent to us      You have an order for:  []   2D Mammogram  [x]   3D Mammogram  []   Bone Density     Please call for appointment:   []   [facility]  [address] [place] [address]  [phone]  []   [facility])   [address]  [place], [place] [address]  [phone]  [x]   [facility]      [address] [place], [place]        [phone]         []   [facility]  [address] [place], [place]  [phone]  []  [facility]   [address]   [place], [place] [address]  [phone]  []  [facility]  [address] [place], [place] [address] [phone]    Make sure to wear two piece clothing  No lotions powders or deodorants the day of the appointment Make sure to bring picture ID and insurance card.  Bring list of medications you are currently taking including any supplements.   Schedule  your screening mammogram through MyChart!   Select [facility] imaging sites can now be scheduled through MyChart.  Log into your MyChart account.  Go to 'Visit' (or 'Appointments' if  on mobile App) --> Schedule an  Appointment  Under 'Select a Reason for Visit' choose the Mammogram  Screening option.  Complete the pre-visit questions  and select the time and place that  best fits your schedule

## 2023-05-22 NOTE — Assessment & Plan Note (Signed)
 Reviewed health habits including diet and exercise and skin cancer prevention Reviewed appropriate screening tests for age  Also reviewed health mt list, fam hx and immunization status , as well as social and family history   See HPI Labs reviewed and ordered Health Maintenance  Topic Date Due   Pneumococcal Vaccination (1 of 2 - PCV) Never done   Eye exam for diabetics  Never done   HIV Screening  Never done   Yearly kidney health urinalysis for diabetes  Never done   Hepatitis C Screening  Never done   Hemoglobin A1C  10/11/2022   Complete foot exam   12/30/2022   Yearly kidney function blood test for diabetes  04/13/2023   Zoster (Shingles) Vaccine (2 of 2) 08/19/2023*   COVID-19 Vaccine (4 - 2024-25 season) 06/06/2024*   Mammogram  03/01/2024   DTaP/Tdap/Td vaccine (2 - Td or Tdap) 02/13/2028   Colon Cancer Screening  03/29/2028   Flu Shot  Completed   HPV Vaccine  Aged Out   Screening for Lung Cancer  Discontinued  *Topic was postponed. The date shown is not the original due date.   Plans to get 2nd shingrix vaccine Mammogram ordered Hep C and HIV screen today  Due for Ct/lung cancer screen in April  Discussed fall prevention, supplements and exercise for bone density
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
## 2023-05-22 NOTE — Assessment & Plan Note (Signed)
 Labs today  Encouraged weight loss with diet and exercise

## 2023-05-22 NOTE — Assessment & Plan Note (Signed)
-

## 2023-05-22 NOTE — Assessment & Plan Note (Signed)
 D level today  Pt has not been taking the supplement  Encouraged her to re start for bone and overall health

## 2023-05-22 NOTE — Assessment & Plan Note (Signed)
Hep C screen today 

## 2023-05-23 ENCOUNTER — Encounter: Payer: Self-pay | Admitting: Family Medicine

## 2023-05-23 DIAGNOSIS — E1169 Type 2 diabetes mellitus with other specified complication: Secondary | ICD-10-CM

## 2023-05-23 LAB — HIV ANTIBODY (ROUTINE TESTING W REFLEX): HIV 1&2 Ab, 4th Generation: NONREACTIVE

## 2023-05-23 LAB — MICROALBUMIN / CREATININE URINE RATIO
Creatinine,U: 112.6 mg/dL
Microalb Creat Ratio: 6.2 mg/g (ref 0.0–30.0)
Microalb, Ur: <0.7 mg/dL (ref 0.0–1.9)

## 2023-05-23 LAB — HEPATITIS C ANTIBODY: Hepatitis C Ab: NONREACTIVE

## 2023-05-24 MED ORDER — ATORVASTATIN CALCIUM 40 MG PO TABS: 40.0000 mg | ORAL_TABLET | Freq: Every day | ORAL | 3 refills | Status: DC

## 2023-05-24 NOTE — Addendum Note (Signed)
Addended by: Roxy Manns A on: 05/24/2023 08:35 PM   Modules accepted: Orders

## 2023-05-26 ENCOUNTER — Telehealth: Payer: Self-pay

## 2023-05-26 DIAGNOSIS — F3289 Other specified depressive episodes: Secondary | ICD-10-CM

## 2023-05-26 MED ORDER — ESCITALOPRAM OXALATE 20 MG PO TABS: 20.0000 mg | ORAL_TABLET | Freq: Every day | ORAL | 2 refills | Status: DC

## 2023-05-26 NOTE — Addendum Note (Signed)
Addended by: Shon Millet on: 05/26/2023 04:59 PM   Modules accepted: Orders

## 2023-05-26 NOTE — Telephone Encounter (Signed)
Copied from CRM [id number]. Topic: Clinical - Prescription Issue >> May 26, 2023  2:28 PM Deaijah H wrote: Reason for CRM: Patient called in stating Dr. Milinda Antis did not reply to [facility] to approve her medication escitalopram (LEXAPRO) 20 MG tablet // order number # [id number] (from [facility] sent to Dr. Milinda Antis) would like to know if it will be refilled / please [phone]

## 2023-05-26 NOTE — Telephone Encounter (Signed)
Rx refilled and sent mychart letting pt know

## 2023-05-29 DIAGNOSIS — Z0289 Encounter for other administrative examinations: Secondary | ICD-10-CM

## 2023-06-09 ENCOUNTER — Ambulatory Visit
Admission: RE | Admit: 2023-06-09 | Discharge: 2023-06-09 | Disposition: A | Discharge status: Home / Self Care | Payer: 59 | Source: Ambulatory Visit | Attending: Family Medicine | Admitting: Family Medicine

## 2023-06-09 DIAGNOSIS — Z1231 Encounter for screening mammogram for malignant neoplasm of breast: Secondary | ICD-10-CM

## 2023-06-13 ENCOUNTER — Encounter: Payer: Self-pay | Admitting: Family Medicine

## 2023-06-29 ENCOUNTER — Ambulatory Visit (INDEPENDENT_AMBULATORY_CARE_PROVIDER_SITE_OTHER): Payer: 59 | Admitting: Family Medicine

## 2023-06-29 ENCOUNTER — Encounter (INDEPENDENT_AMBULATORY_CARE_PROVIDER_SITE_OTHER): Payer: Self-pay | Admitting: Family Medicine

## 2023-06-29 VITALS — BP 121/74 | HR 91 | Temp 98.2°F | Ht 61.0 in | Wt 230.0 lb

## 2023-06-29 DIAGNOSIS — E669 Obesity, unspecified: Secondary | ICD-10-CM

## 2023-06-29 DIAGNOSIS — E1169 Type 2 diabetes mellitus with other specified complication: Secondary | ICD-10-CM

## 2023-06-29 DIAGNOSIS — R5383 Other fatigue: Secondary | ICD-10-CM

## 2023-06-29 DIAGNOSIS — Z6841 Body Mass Index (BMI) 40.0 and over, adult: Secondary | ICD-10-CM

## 2023-06-29 DIAGNOSIS — R0602 Shortness of breath: Secondary | ICD-10-CM | POA: Diagnosis not present

## 2023-06-29 DIAGNOSIS — E785 Hyperlipidemia, unspecified: Secondary | ICD-10-CM

## 2023-06-29 DIAGNOSIS — E559 Vitamin D deficiency, unspecified: Secondary | ICD-10-CM | POA: Diagnosis not present

## 2023-06-29 DIAGNOSIS — M545 Low back pain, unspecified: Secondary | ICD-10-CM | POA: Diagnosis not present

## 2023-06-29 DIAGNOSIS — E119 Type 2 diabetes mellitus without complications: Secondary | ICD-10-CM | POA: Diagnosis not present

## 2023-06-29 DIAGNOSIS — G8929 Other chronic pain: Secondary | ICD-10-CM

## 2023-06-29 NOTE — Progress Notes (Signed)
 Office: [phone]  /  Fax: [phone]  WEIGHT SUMMARY AND BIOMETRICS  Anthropometric Measurements Height: 5\' 1"  (1.549 m) Weight: 230 lb (104.3 kg) BMI (Calculated): 43.48 Starting Weight: 230 lb Peak Weight: 230 lb Waist Measurement : 50 inches   Body Composition  Body Fat %: 48.4 % Fat Mass (lbs): 111.6 lbs Muscle Mass (lbs): 112.8 lbs Total Body Water (lbs): 85.8 lbs Visceral Fat Rating : 16   Other Clinical Data RMR: 1915 Fasting: yes Labs: yes Today's Visit #: 1 Starting Date: 06/29/23    Chief Complaint: OBESITY    History of Present Illness   Betty Snyder is a 58 year old female who presents with obesity.  She is seeking medical advice to address her obesity and explore weight loss strategies. She is not interested in weight loss surgery and aims to reach a weight of 140 pounds within a year. She is currently at her heaviest weight, which impacts her mental and physical well-being. She experiences fatigue and shortness of breath with exercise, attributing these symptoms to her weight. No excessive hunger is reported, but she struggles with portion control and often eats until uncomfortably full. Emotional eating occurs when stressed, bored, or angry.  She has a history of type 2 diabetes, diagnosed with an A1c of 6.7, which she does not being classified as diabetes. She is motivated to manage her weight to avoid the label of diabetes. Her sister underwent stomach surgery and is no longer diabetic, but she prefers non-surgical methods.  Her dietary habits include eating out two to three times a week, consuming takeout or fast food three times a week, and snacking on chips, ice cream, and fruit. She identifies ice cream as her worst food habit and struggles with poor food choices. She has attempted to reduce sugar intake in the past. She describes herself as a picky eater, particularly craving [place] food, and dislikes most vegetables. She does not like to  cook but grocery shops weekly.  She reports chronic low back pain, which worsens with walking and radiates to the buttocks, possibly involving the sciatic nerve. She notes that her weight exacerbates the pain, which was particularly severe during a recent trip to [place], [place] where she had to stop frequently due to pain.  She sleeps about seven to eight hours per night but does not wake up refreshed. She has an Epworth sleepiness score of seven, does not snore, and has experienced waking up gasping for air. No morning headaches. Her calculated basal metabolic rate is 8657, and her measured resting energy expenditure is 1915, which is better than expected.  She also reports hyperlipidemia and vitamin D deficiency.          PHYSICAL EXAM:  Blood pressure 121/74, pulse 91, temperature 98.2 F (36.8 C), height 5\' 1"  (1.549 m), weight 230 lb (104.3 kg), last menstrual period 10/22/2013, SpO2 100%. Body mass index is 43.46 kg/m.  DIAGNOSTIC DATA REVIEWED:  BMET    Component Value Date/Time   NA 139 05/22/2023 0909   NA 140 02/18/2019 1112   K 4.6 05/22/2023 0909   CL 102 05/22/2023 0909   CO2 28 05/22/2023 0909   GLUCOSE 131 (H) 05/22/2023 0909   BUN 17 05/22/2023 0909   BUN 12 02/18/2019 1112   CREATININE 0.63 05/22/2023 0909   CALCIUM 9.8 05/22/2023 0909   GFRNONAA 92 02/18/2019 1112   GFRAA 106 02/18/2019 1112   Lab Results  Component Value Date   HGBA1C 6.8 (H) 05/22/2023  HGBA1C 5.9 02/18/2016   Lab Results  Component Value Date   INSULIN 15.7 02/18/2019   INSULIN 8.3 05/08/2018   Lab Results  Component Value Date   TSH 1.91 05/22/2023   CBC    Component Value Date/Time   WBC 7.6 12/22/2021 0747   RBC 4.80 12/22/2021 0747   HGB 14.3 12/22/2021 0747   HGB 14.2 05/08/2018 1035   HCT 42.9 12/22/2021 0747   HCT 43.3 05/08/2018 1035   PLT 244.0 12/22/2021 0747   MCV 89.3 12/22/2021 0747   MCV 90 05/08/2018 1035   MCH 29.1 11/30/2018 0835   MCHC 33.3  12/22/2021 0747   RDW 13.8 12/22/2021 0747   RDW 12.4 05/08/2018 1035   Iron Studies No results found for: "IRON", "TIBC", "FERRITIN", "IRONPCTSAT" Lipid Panel     Component Value Date/Time   CHOL 192 05/22/2023 0909   CHOL 247 (H) 02/18/2019 1112   TRIG 388.0 (H) 05/22/2023 0909   HDL 40.70 05/22/2023 0909   HDL 45 02/18/2019 1112   CHOLHDL 5 05/22/2023 0909   VLDL 77.6 (H) 05/22/2023 0909   LDLCALC 74 05/22/2023 0909   LDLCALC 82 03/03/2021 1630   LDLDIRECT 118.0 05/22/2023 0909   Hepatic Function Panel     Component Value Date/Time   PROT 7.1 05/22/2023 0909   PROT 6.8 02/18/2019 1112   ALBUMIN 4.3 05/22/2023 0909   ALBUMIN 4.5 02/18/2019 1112   AST 21 05/22/2023 0909   ALT 32 05/22/2023 0909   ALKPHOS 84 05/22/2023 0909   BILITOT 0.3 05/22/2023 0909   BILITOT 0.2 02/18/2019 1112      Component Value Date/Time   TSH 1.91 05/22/2023 0909   Nutritional Lab Results  Component Value Date   VD25OH 25.64 (L) 05/22/2023   VD25OH 39.06 12/22/2021   VD25OH 27 (L) 03/03/2021     Assessment and Plan    Obesity She is at her heaviest weight and aims to lose weight to improve her mental and physical health, targeting 140 pounds within a year. She prefers dietary and lifestyle modifications over weight loss surgery. She struggles with poor food choices, portion control, and emotional eating, frequently consuming fast food and takeout, and dislikes cooking and vegetables. Her basal metabolic rate is 1610, and her resting energy expenditure is 1915, indicating her metabolism will not impede weight loss efforts. - Initiate a modified Category 2 eating plan with increased snack calories to 300 and dinner protein to 8 ounces. - Encourage meal planning and preparation, focusing on simple and fresh quality meals. - Discuss strategies for incorporating vegetables into the diet. - Focus on dietary changes before introducing exercise.  Type 2 Diabetes Mellitus Diagnosed with type 2  diabetes with an A1c of 6.7%. She is resistant to the diagnosis due to previous understanding that under 7% was not diabetic. Explained genetic predisposition and the importance of maintaining controlled blood sugar levels to prevent complications. Emphasized that controlled diabetes is akin to being in remission, preventing complications such as heart disease and kidney failure. - Use the diagnosis of diabetes to ensure appropriate care and treatment options. - Monitor blood sugar levels and maintain a controlled diet as per the modified Category 2 plan.  Hyperlipidemia She has hyperlipidemia, which is being addressed as part of her overall health improvement plan. Dietary changes are expected to positively impact lipid levels. - Implement dietary changes as per the modified Category 2 eating plan to help manage lipid levels.  Chronic Low Back Pain She experiences chronic low back  pain, which worsens with activity and may radiate to the buttocks. The pain is suspected to be related to weight but may have other underlying causes. Referral to a sports medicine specialist is planned to evaluate and manage the pain, with potential treatments including physical therapy and radiotherapy ablation, which has shown to reduce pain by 80% in similar cases. - Refer to Dr. Denyse Amass, a sports medicine specialist, for evaluation and management of chronic low back pain.  Vitamin D Deficiency She has vitamin D deficiency, which is being monitored as part of her overall health management. - Check vitamin D levels as part of the lab workup.  General Health Maintenance She is working on improving her overall health through weight loss and dietary changes. She is mindful of her eating habits and is motivated to make positive changes. - Conduct a comprehensive lab workup to evaluate contributing factors to her health conditions. - Encourage adherence to the modified Category 2 eating plan for overall health improvement.          I have personally spent 60 minutes total time today in preparation, patient care, and documentation for this visit, including the following: review of clinical lab tests; review of medical tests/procedures/services.    She was informed of the importance of frequent follow up visits to maximize her success with intensive lifestyle modifications for her multiple health conditions.    Quillian Quince, MD

## 2023-07-01 LAB — CBC WITH DIFFERENTIAL/PLATELET
Basophils Absolute: 0 10*3/uL (ref 0.0–0.2)
Basos: 0 %
EOS (ABSOLUTE): 0.1 10*3/uL (ref 0.0–0.4)
Eos: 1 %
Hematocrit: 46.7 % — ABNORMAL HIGH (ref 34.0–46.6)
Hemoglobin: 15.3 g/dL (ref 11.1–15.9)
Immature Grans (Abs): 0 10*3/uL (ref 0.0–0.1)
Immature Granulocytes: 0 %
Lymphocytes Absolute: 2.2 10*3/uL (ref 0.7–3.1)
Lymphs: 24 %
MCH: 29.4 pg (ref 26.6–33.0)
MCHC: 32.8 g/dL (ref 31.5–35.7)
MCV: 90 fL (ref 79–97)
Monocytes Absolute: 0.6 10*3/uL (ref 0.1–0.9)
Monocytes: 6 %
Neutrophils Absolute: 6.2 10*3/uL (ref 1.4–7.0)
Neutrophils: 69 %
Platelets: 269 10*3/uL (ref 150–450)
RBC: 5.21 x10E6/uL (ref 3.77–5.28)
RDW: 13.3 % (ref 11.7–15.4)
WBC: 9.1 10*3/uL (ref 3.4–10.8)

## 2023-07-01 LAB — CMP14+EGFR
ALT: 37 IU/L — ABNORMAL HIGH (ref 0–32)
AST: 32 IU/L (ref 0–40)
Albumin: 4.8 g/dL (ref 3.8–4.9)
Alkaline Phosphatase: 91 IU/L (ref 44–121)
BUN/Creatinine Ratio: 17 (ref 9–23)
BUN: 13 mg/dL (ref 6–24)
Bilirubin Total: 0.4 mg/dL (ref 0.0–1.2)
CO2: 22 mmol/L (ref 20–29)
Calcium: 9.7 mg/dL (ref 8.7–10.2)
Chloride: 100 mmol/L (ref 96–106)
Creatinine, Ser: 0.76 mg/dL (ref 0.57–1.00)
Globulin, Total: 2.7 g/dL (ref 1.5–4.5)
Glucose: 103 mg/dL — ABNORMAL HIGH (ref 70–99)
Potassium: 4.9 mmol/L (ref 3.5–5.2)
Sodium: 138 mmol/L (ref 134–144)
Total Protein: 7.5 g/dL (ref 6.0–8.5)
eGFR: 90 mL/min/{1.73_m2} (ref >59)

## 2023-07-01 LAB — TSH: TSH: 2.59 u[IU]/mL (ref 0.450–4.500)

## 2023-07-01 LAB — LIPID PANEL WITH LDL/HDL RATIO
Cholesterol, Total: 139 mg/dL (ref 100–199)
HDL: 39 mg/dL — ABNORMAL LOW (ref >39)
LDL Chol Calc (NIH): 71 mg/dL (ref 0–99)
LDL/HDL Ratio: 1.8 ratio (ref 0.0–3.2)
Triglycerides: 172 mg/dL — ABNORMAL HIGH (ref 0–149)
VLDL Cholesterol Cal: 29 mg/dL (ref 5–40)

## 2023-07-01 LAB — HEMOGLOBIN A1C
Est. average glucose Bld gHb Est-mCnc: 143 mg/dL
Hgb A1c MFr Bld: 6.6 % — ABNORMAL HIGH (ref 4.8–5.6)

## 2023-07-01 LAB — T3: T3, Total: 155 ng/dL (ref 71–180)

## 2023-07-01 LAB — FOLATE: Folate: 5 ng/mL (ref >3.0)

## 2023-07-01 LAB — T4, FREE: Free T4: 0.93 ng/dL (ref 0.82–1.77)

## 2023-07-01 LAB — INSULIN, RANDOM: INSULIN: 38 u[IU]/mL — ABNORMAL HIGH (ref 2.6–24.9)

## 2023-07-01 LAB — MICROALBUMIN / CREATININE URINE RATIO
Creatinine, Urine: 131.7 mg/dL
Microalb/Creat Ratio: 7 mg/g{creat} (ref 0–29)
Microalbumin, Urine: 8.7 ug/mL

## 2023-07-01 LAB — VITAMIN D 25 HYDROXY (VIT D DEFICIENCY, FRACTURES): Vit D, 25-Hydroxy: 37.7 ng/mL (ref 30.0–100.0)

## 2023-07-01 LAB — VITAMIN B12: Vitamin B-12: 1043 pg/mL (ref 232–1245)

## 2023-07-03 ENCOUNTER — Other Ambulatory Visit: Payer: 59

## 2023-07-12 ENCOUNTER — Encounter (INDEPENDENT_AMBULATORY_CARE_PROVIDER_SITE_OTHER): Payer: Self-pay | Admitting: Family Medicine

## 2023-07-12 ENCOUNTER — Ambulatory Visit (INDEPENDENT_AMBULATORY_CARE_PROVIDER_SITE_OTHER): Payer: 59 | Admitting: Family Medicine

## 2023-07-12 VITALS — BP 128/83 | HR 74 | Temp 98.0°F | Ht 61.0 in | Wt 225.0 lb

## 2023-07-12 DIAGNOSIS — R7989 Other specified abnormal findings of blood chemistry: Secondary | ICD-10-CM

## 2023-07-12 DIAGNOSIS — Z6841 Body Mass Index (BMI) 40.0 and over, adult: Secondary | ICD-10-CM

## 2023-07-12 DIAGNOSIS — E1169 Type 2 diabetes mellitus with other specified complication: Secondary | ICD-10-CM

## 2023-07-12 DIAGNOSIS — E785 Hyperlipidemia, unspecified: Secondary | ICD-10-CM

## 2023-07-12 DIAGNOSIS — E559 Vitamin D deficiency, unspecified: Secondary | ICD-10-CM | POA: Diagnosis not present

## 2023-07-12 DIAGNOSIS — R0683 Snoring: Secondary | ICD-10-CM

## 2023-07-12 DIAGNOSIS — M545 Low back pain, unspecified: Secondary | ICD-10-CM

## 2023-07-12 DIAGNOSIS — E119 Type 2 diabetes mellitus without complications: Secondary | ICD-10-CM

## 2023-07-12 DIAGNOSIS — E669 Obesity, unspecified: Secondary | ICD-10-CM

## 2023-07-12 DIAGNOSIS — G8929 Other chronic pain: Secondary | ICD-10-CM | POA: Insufficient documentation

## 2023-07-12 NOTE — Progress Notes (Signed)
 Office: [phone]  /  Fax: [phone]  WEIGHT SUMMARY AND BIOMETRICS  Anthropometric Measurements Height: 5\' 1"  (1.549 m) Weight: 225 lb (102.1 kg) BMI (Calculated): 42.54 Weight at Last Visit: 230 lb Weight Lost Since Last Visit: 5 lb Weight Gained Since Last Visit: 0 Starting Weight: 230 lb Total Weight Loss (lbs): 5 lb (2.268 kg) Peak Weight: 230 lb   Body Composition  Body Fat %: 48.7 % Fat Mass (lbs): 110 lbs Muscle Mass (lbs): 110 lbs Total Body Water (lbs): 86.6 lbs Visceral Fat Rating : 16   No data recorded  Chief Complaint: OBESITY   History of Present Illness Betty Snyder is a 58 year old female who presents for a follow-up on obesity treatment and lab results review.  She is adhering to a category two eating plan 95% of the time and has achieved a weight loss of five pounds over the past two weeks. She is not engaging in any exercise currently. She experiences significant hunger by mid-morning despite consuming boiled eggs for breakfast. She has not yet tried yogurt but has purchased some and is considering adding granola.  Her recent laboratory results indicate a fasting glucose level of 103 mg/dL, which is slightly elevated, and an improvement in A1c from 6.8% to 6.6%. She is not on any medications for blood sugar control. Her HDL cholesterol is low at 39 mg/dL, triglycerides are slightly elevated at 172 mg/dL, and LDL cholesterol is at 71 mg/dL. She is not on any medications for cholesterol management.  Her liver enzyme ALT is mildly elevated, a condition noted in the past, likely associated with weight gain and fatty liver. Her vitamin D level has improved but remains below the target of 50 ng/mL. She is taking 5000 IU of vitamin D daily. Her B12 level is at the high end of normal.  She has not been diagnosed with sleep apnea and has not undergone a sleep study. She reports waking herself up due to snoring and has been informed that she snores loudly.  She has not pursued a sleep study due to discomfort with the idea of using a CPAP machine.  She experiences chronic back pain and was previously referred to Dr. Denyse Amass for this issue, but she has not been contacted by his office.      PHYSICAL EXAM:  Blood pressure 128/83, pulse 74, temperature 98 F (36.7 C), height 5\' 1"  (1.549 m), weight 225 lb (102.1 kg), last menstrual period 10/22/2013, SpO2 92%. Body mass index is 42.51 kg/m.  DIAGNOSTIC DATA REVIEWED:  BMET    Component Value Date/Time   NA 138 06/29/2023 0844   K 4.9 06/29/2023 0844   CL 100 06/29/2023 0844   CO2 22 06/29/2023 0844   GLUCOSE 103 (H) 06/29/2023 0844   GLUCOSE 131 (H) 05/22/2023 0909   BUN 13 06/29/2023 0844   CREATININE 0.76 06/29/2023 0844   CALCIUM 9.7 06/29/2023 0844   GFRNONAA 92 02/18/2019 1112   GFRAA 106 02/18/2019 1112   Lab Results  Component Value Date   HGBA1C 6.6 (H) 06/29/2023   HGBA1C 5.9 02/18/2016   Lab Results  Component Value Date   INSULIN 38.0 (H) 06/29/2023   INSULIN 8.3 05/08/2018   Lab Results  Component Value Date   TSH 2.590 06/29/2023   CBC    Component Value Date/Time   WBC 9.1 06/29/2023 0844   WBC 7.6 12/22/2021 0747   RBC 5.21 06/29/2023 0844   RBC 4.80 12/22/2021 0747   HGB 15.3  06/29/2023 0844   HCT 46.7 (H) 06/29/2023 0844   PLT 269 06/29/2023 0844   MCV 90 06/29/2023 0844   MCH 29.4 06/29/2023 0844   MCH 29.1 11/30/2018 0835   MCHC 32.8 06/29/2023 0844   MCHC 33.3 12/22/2021 0747   RDW 13.3 06/29/2023 0844   Iron Studies No results found for: "IRON", "TIBC", "FERRITIN", "IRONPCTSAT" Lipid Panel     Component Value Date/Time   CHOL 139 06/29/2023 0844   TRIG 172 (H) 06/29/2023 0844   HDL 39 (L) 06/29/2023 0844   CHOLHDL 5 05/22/2023 0909   VLDL 77.6 (H) 05/22/2023 0909   LDLCALC 71 06/29/2023 0844   LDLCALC 82 03/03/2021 1630   LDLDIRECT 118.0 05/22/2023 0909   Hepatic Function Panel     Component Value Date/Time   PROT 7.5  06/29/2023 0844   ALBUMIN 4.8 06/29/2023 0844   AST 32 06/29/2023 0844   ALT 37 (H) 06/29/2023 0844   ALKPHOS 91 06/29/2023 0844   BILITOT 0.4 06/29/2023 0844      Component Value Date/Time   TSH 2.590 06/29/2023 0844   Nutritional Lab Results  Component Value Date   VD25OH 37.7 06/29/2023   VD25OH 25.64 (L) 05/22/2023   VD25OH 39.06 12/22/2021     Assessment and Plan Assessment & Plan Obesity She has adhered to a category two eating plan 95% of the time, resulting in a 5-pound weight loss over the past two weeks. She is not currently exercising. The breakfast portion of the plan is challenging due to mid-morning hunger. Discussed the importance of protein intake and calorie range for breakfast to manage hunger and support weight loss. - Continue category two eating plan - Ensure breakfast includes 300-450 calories and at least 30 grams of protein - Use My Fitness Pal app to track food intake - Consider high-protein options like Fairlife milk and Kodiak pancakes  Type 2 Diabetes Fasting glucose is slightly elevated at 103 mg/dL, and Z6X has improved from 6.8% to 6.6%. Insulin level is high at 38, indicating insulin resistance, contributing to hunger and weight gain. Discussed metformin as a potential treatment, noting its safety and efficacy, but she prefers dietary management. Emphasized that dietary management is 80% of diabetes control, while medication is 20%. - Reassess blood glucose and A1c in 3 months - Focus on dietary management to control blood sugar  Hyperlipidemia HDL is low at 39, triglycerides are slightly elevated at 172, and LDL is near target at 71. Explained the importance of HDL and the impact of carbohydrate intake on triglycerides. Exercise is recommended to increase HDL levels. - Encourage regular exercise to increase HDL - Monitor lipid levels  Elevated Liver Enzymes Mildly elevated ALT, likely due to hepatic steatosis associated with obesity. Weight  loss typically improves liver enzyme levels. - Recheck liver enzymes in 3 months  Vitamin D Deficiency Vitamin D levels have improved but are not yet at the target of 50. She is currently taking 5000 IU of vitamin D daily. - Continue 5000 IU of vitamin D daily - Recheck vitamin D levels in 3 months  Snoring with Elevated Hematocrit Elevated hematocrit and hemoglobin may suggest sleep apnea. She reports snoring and waking herself up, but is hesitant about sleep studies and CPAP use. Discussed potential cardiovascular risks of untreated sleep apnea and alternative CPAP options, including quieter machines and nasal masks. - Refer to Dr. Richardean Chimera for sleep consultation  Chronic Back Pain She has chronic back pain and was previously referred to Dr. Denyse Amass for  evaluation. She misplaced the contact information and has not been contacted by the office. - Provide Dr. Zollie Pee contact information again  Follow-up She will need follow-up to assess progress on weight loss, blood sugar control, and other health parameters. - Follow up in 2-3 weeks     I have personally spent 50 minutes total time today in preparation, patient care, and documentation for this visit, including the following: review of clinical lab tests; review of medical tests/procedures/services.    She was informed of the importance of frequent follow up visits to maximize her success with intensive lifestyle modifications for her multiple health conditions.    Quillian Quince, MD

## 2023-07-20 ENCOUNTER — Ambulatory Visit (INDEPENDENT_AMBULATORY_CARE_PROVIDER_SITE_OTHER)

## 2023-07-20 ENCOUNTER — Ambulatory Visit: Admitting: Family Medicine

## 2023-07-20 VITALS — BP 128/86 | HR 78 | Ht 61.0 in | Wt 230.0 lb

## 2023-07-20 DIAGNOSIS — M545 Low back pain, unspecified: Secondary | ICD-10-CM | POA: Diagnosis not present

## 2023-07-20 DIAGNOSIS — G8929 Other chronic pain: Secondary | ICD-10-CM | POA: Diagnosis not present

## 2023-07-20 MED ORDER — TIZANIDINE HCL 2 MG PO TABS: 2.0000 mg | ORAL_TABLET | Freq: Three times a day (TID) | ORAL | 1 refills | Status: DC | PRN

## 2023-07-20 NOTE — Progress Notes (Unsigned)
   Rubin Payor, PhD, LAT, ATC acting as a scribe for Clementeen Graham, MD.  AANIKA DEFOOR is a 58 y.o. female who presents to [facility] at [facility] today for LBP x 4 years, progressively worsening. She notes continual weight gain. Pt locates pain to both sides of her low back. Typically the L-side is worse. Pain was worse when she was doing a lot of walking while visiting in [place].  Radiating pain: no LE numbness/tingling: yes- L-side LE weakness: yes- but more of a "heaviness" Aggravates: prolonged walking, turning the wrong way Treatments tried: stretching,   Pertinent review of systems: No fevers or chills  Relevant historical information: Coronary artery disease and diabetes.   Exam:  BP 128/86   Pulse 78   Ht 5\' 1"  (1.549 m)   Wt 230 lb (104.3 kg)   LMP 10/22/2013   SpO2 98%   BMI 43.46 kg/m  General: Well Developed, well nourished, and in no acute distress.   MSK: L-spine: Normal appearing Nontender palpation spinal midline. Tender palpation paraspinal musculature. Decreased lumbar motion. Lower extremity strength is intact. Reflexes are intact.    Lab and Radiology Results  X-ray images lumbar spine obtained today personally and independently interpreted. DDD and facet DJD L4-5 and L5-S1.  No acute fractures are visible. Await formal radiology review    Assessment and Plan: 58 y.o. female with chronic low back pain.  Pain due to degenerative changes lumbar spine and muscle dysfunction paraspinal musculature.  Plan for physical therapy referral.  Additionally plan to use tizanidine at bedtime.  Recommend heating pad. Recheck in about 6 weeks.   PDMP not reviewed this encounter. Orders Placed This Encounter  Procedures   DG Lumbar Spine 2-3 Views    Standing Status:   Future    Number of Occurrences:   1    Expiration Date:   08/19/2023    Reason for Exam (SYMPTOM  OR DIAGNOSIS REQUIRED):   low back pain    Preferred imaging  location?:   [facility]    Is patient pregnant?:   No   Ambulatory referral to Physical Therapy    Referral Priority:   Routine    Referral Type:   Physical Medicine    Referral Reason:   Specialty Services Required    Requested Specialty:   Physical Therapy    Number of Visits Requested:   1   Meds ordered this encounter  Medications   tiZANidine (ZANAFLEX) 2 MG tablet    Sig: Take 1-2 tablets (2-4 mg total) by mouth every 8 (eight) hours as needed.    Dispense:  180 tablet    Refill:  1     Discussed warning signs or symptoms. Please see discharge instructions. Patient expresses understanding.   The above documentation has been reviewed and is accurate and complete Clementeen Graham, M.D.

## 2023-07-20 NOTE — Patient Instructions (Addendum)
 Thank you for coming in today.   Please get an Xray today before you leave   I've referred you to Physical Therapy.  Let us know if you don't hear from them in one week.   Try the tizanidine at bedtime for pain as needed.   Recheck with me in about 6 weeks.   Let me know if you have any problem.

## 2023-07-31 NOTE — Progress Notes (Signed)
 Low back x-ray shows mild arthritis changes.

## 2023-08-01 ENCOUNTER — Other Ambulatory Visit: Payer: Self-pay | Admitting: Acute Care

## 2023-08-01 DIAGNOSIS — Z87891 Personal history of nicotine dependence: Secondary | ICD-10-CM

## 2023-08-01 DIAGNOSIS — Z122 Encounter for screening for malignant neoplasm of respiratory organs: Secondary | ICD-10-CM

## 2023-08-02 ENCOUNTER — Encounter (INDEPENDENT_AMBULATORY_CARE_PROVIDER_SITE_OTHER): Payer: Self-pay | Admitting: Family Medicine

## 2023-08-02 ENCOUNTER — Ambulatory Visit (INDEPENDENT_AMBULATORY_CARE_PROVIDER_SITE_OTHER): Admitting: Family Medicine

## 2023-08-02 VITALS — BP 111/75 | HR 79 | Temp 98.3°F | Ht 61.0 in | Wt 223.0 lb

## 2023-08-02 DIAGNOSIS — Z6841 Body Mass Index (BMI) 40.0 and over, adult: Secondary | ICD-10-CM

## 2023-08-02 DIAGNOSIS — R5383 Other fatigue: Secondary | ICD-10-CM

## 2023-08-02 DIAGNOSIS — E669 Obesity, unspecified: Secondary | ICD-10-CM

## 2023-08-02 DIAGNOSIS — E559 Vitamin D deficiency, unspecified: Secondary | ICD-10-CM | POA: Diagnosis not present

## 2023-08-02 NOTE — Progress Notes (Signed)
 Office: [phone]  /  Fax: [phone]  WEIGHT SUMMARY AND BIOMETRICS  Anthropometric Measurements Height: 5\' 1"  (1.549 m) Weight: 223 lb (101.2 kg) BMI (Calculated): 42.16 Weight at Last Visit: 225 lb Weight Lost Since Last Visit: 2 lb Weight Gained Since Last Visit: 0 Starting Weight: 230 lb Total Weight Loss (lbs): 7 lb (3.175 kg) Peak Weight: 230 lb Waist Measurement : 50 inches   Body Composition  Body Fat %: 47.7 % Fat Mass (lbs): 106.6 lbs Muscle Mass (lbs): 110.8 lbs Total Body Water (lbs): 85.8 lbs Visceral Fat Rating : 16   Other Clinical Data RMR: 1915 Fasting: no Labs: no Today's Visit #: 3 Starting Date: 06/29/23    Chief Complaint: OBESITY    History of Present Illness Betty Snyder is a 58 year old female who presents for obesity treatment and progress assessment.  She has been adhering to a category two eating plan and has successfully lost seven pounds, with two pounds lost in the last three weeks. She encountered challenges during Easter but managed portion control by bringing her own lunch and having a small serving of banana pudding. She is motivated to lose weight for her son's wedding in September.  She is not currently engaging in regular exercise but is contemplating starting with activities like walking around her workplace. She previously enjoyed walking and misses the feeling of being active.  She experiences frequent nocturnal urination, waking up about three times on a bad night and once on a good night. She has been trying to increase her water intake, which disrupts her sleep.  She is taking over-the-counter vitamin D , 5000 IU daily, but questions its effectiveness as her levels have not improved significantly, with a current level of 37 compared to 27 a couple of years ago.      PHYSICAL EXAM:  Blood pressure 111/75, pulse 79, temperature 98.3 F (36.8 C), height 5\' 1"  (1.549 m), weight 223 lb (101.2 kg), last menstrual  period 10/22/2013, SpO2 91%. Body mass index is 42.14 kg/m.  DIAGNOSTIC DATA REVIEWED:  BMET    Component Value Date/Time   NA 138 06/29/2023 0844   K 4.9 06/29/2023 0844   CL 100 06/29/2023 0844   CO2 22 06/29/2023 0844   GLUCOSE 103 (H) 06/29/2023 0844   GLUCOSE 131 (H) 05/22/2023 0909   BUN 13 06/29/2023 0844   CREATININE 0.76 06/29/2023 0844   CALCIUM  9.7 06/29/2023 0844   GFRNONAA 92 02/18/2019 1112   GFRAA 106 02/18/2019 1112   Lab Results  Component Value Date   HGBA1C 6.6 (H) 06/29/2023   HGBA1C 5.9 02/18/2016   Lab Results  Component Value Date   INSULIN  38.0 (H) 06/29/2023   INSULIN  8.3 05/08/2018   Lab Results  Component Value Date   TSH 2.590 06/29/2023   CBC    Component Value Date/Time   WBC 9.1 06/29/2023 0844   WBC 7.6 12/22/2021 0747   RBC 5.21 06/29/2023 0844   RBC 4.80 12/22/2021 0747   HGB 15.3 06/29/2023 0844   HCT 46.7 (H) 06/29/2023 0844   PLT 269 06/29/2023 0844   MCV 90 06/29/2023 0844   MCH 29.4 06/29/2023 0844   MCH 29.1 11/30/2018 0835   MCHC 32.8 06/29/2023 0844   MCHC 33.3 12/22/2021 0747   RDW 13.3 06/29/2023 0844   Iron Studies No results found for: "IRON", "TIBC", "FERRITIN", "IRONPCTSAT" Lipid Panel     Component Value Date/Time   CHOL 139 06/29/2023 0844   TRIG 172 (H)  06/29/2023 0844   HDL 39 (L) 06/29/2023 0844   CHOLHDL 5 05/22/2023 0909   VLDL 77.6 (H) 05/22/2023 0909   LDLCALC 71 06/29/2023 0844   LDLCALC 82 03/03/2021 1630   LDLDIRECT 118.0 05/22/2023 0909   Hepatic Function Panel     Component Value Date/Time   PROT 7.5 06/29/2023 0844   ALBUMIN 4.8 06/29/2023 0844   AST 32 06/29/2023 0844   ALT 37 (H) 06/29/2023 0844   ALKPHOS 91 06/29/2023 0844   BILITOT 0.4 06/29/2023 0844      Component Value Date/Time   TSH 2.590 06/29/2023 0844   Nutritional Lab Results  Component Value Date   VD25OH 37.7 06/29/2023   VD25OH 25.64 (L) 05/22/2023   VD25OH 39.06 12/22/2021     Assessment and  Plan Assessment & Plan Obesity She has consistently followed her category two eating plan, resulting in a total weight loss of seven pounds, with two pounds lost in the last three weeks. The weight loss is primarily fat, with a slight shift in water weight. The goal is to lose approximately one pound of fat per week to avoid metabolic slowdown. She is not currently exercising. - Encourage continuation of the current eating plan. - Advise starting an exercise routine, such as walking for 10-15 minutes daily, to allow for increased protein intake and support weight loss. - Reframe weight loss efforts as a health improvement journey.  Fatigue Fatigue may be related to obesity and potential sleep disturbances. She reports frequent nocturnal awakenings to urinate, likely linked to sleep disturbances rather than bladder issues. A sleep study is planned to evaluate for possible sleep apnea or other sleep disorders. - Proceed with scheduled sleep study to assess for sleep disturbances. - Encourage increased physical activity to improve energy levels.  Vitamin D  deficiency She is taking over-the-counter vitamin D  at 5000 IU daily. Previous levels improved from 27 to 37, but further improvement is desired. Vitamin D  is fat-soluble and may be stored in fat cells, potentially affecting blood levels. Weight loss may help release stored vitamin D . - Continue over-the-counter vitamin D  supplementation at 5000 IU daily. - Re-evaluate vitamin D  levels in June to assess improvement.      She was informed of the importance of frequent follow up visits to maximize her success with intensive lifestyle modifications for her multiple health conditions.    Jasmine Mesi, MD

## 2023-08-08 ENCOUNTER — Ambulatory Visit: Admitting: Neurology

## 2023-08-08 ENCOUNTER — Encounter: Payer: Self-pay | Admitting: Neurology

## 2023-08-08 VITALS — BP 118/84 | HR 82 | Ht 67.0 in | Wt 224.0 lb

## 2023-08-08 DIAGNOSIS — G2581 Restless legs syndrome: Secondary | ICD-10-CM

## 2023-08-08 DIAGNOSIS — G478 Other sleep disorders: Secondary | ICD-10-CM

## 2023-08-08 DIAGNOSIS — R519 Headache, unspecified: Secondary | ICD-10-CM

## 2023-08-08 DIAGNOSIS — F519 Sleep disorder not due to a substance or known physiological condition, unspecified: Secondary | ICD-10-CM

## 2023-08-08 DIAGNOSIS — R0683 Snoring: Secondary | ICD-10-CM

## 2023-08-08 DIAGNOSIS — R351 Nocturia: Secondary | ICD-10-CM

## 2023-08-08 DIAGNOSIS — R002 Palpitations: Secondary | ICD-10-CM

## 2023-08-08 MED ORDER — ALPRAZOLAM 0.25 MG PO TABS: 0.2500 mg | ORAL_TABLET | Freq: Every evening | ORAL | 0 refills | Status: DC | PRN

## 2023-08-08 NOTE — Patient Instructions (Signed)
 ASSESSMENT AND PLAN:   58 y.o. year old female patient of Dr.Beasley here with:     1) excessive daytime sleepiness  and RLS , in a patient with high risk factors for ODA by BMI ( 35 ) , neck size (18") and airway anatomy.  2) severely fragmented sleep, due to choking, snoring and nocturia.  3) Delayed sleep onset due to RLS.  4) sleep talking , vivid dreams, active dreams. 5) former smoker. 6) very frequent nocturia and 7)  heart palpitations.    [name] is a 58 year old Caucasian right-handed female patient with a history of weight loss prepandemic and then a long hiatus of poor access to healthcare during the pandemic followed she has regained the weight she had initially lost, currently about body mass index is at 35, but she has other risk factors that are putting her at high risk for obstructive sleep apnea.  There is also the report of snoring, all sleep choking or sometimes feeling that she is short of breath.  There is the report of waking up with a dry mouth but not with headaches.  In addition to not feeling refreshed and restored in the morning she added that her sleep is interrupted by nocturia sometimes she has to go hourly to the bathroom other nights every 90 minutes or every 2 hours but it is very frequent.  She also reports having restless legs which can delay her sleep onset.   Also she is spends 6 to 7 hours each night in bed she may only get 5 hours or less of sleep truly.  In those hours she does have vivid dreams.   My goal is to evaluate her for restless legs with PLM's, obstructive versus central sleep apnea and to look if there are other reasons for arousals and why this patient has such severe urinary frequency.     I ordered a sleep study in the lab, but if [facility] will deny the study I will get a HST instead.
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
## 2023-08-10 ENCOUNTER — Other Ambulatory Visit: Payer: Self-pay

## 2023-08-10 ENCOUNTER — Ambulatory Visit: Admitting: Physical Therapy

## 2023-08-10 ENCOUNTER — Encounter: Payer: Self-pay | Admitting: Physical Therapy

## 2023-08-10 DIAGNOSIS — M6281 Muscle weakness (generalized): Secondary | ICD-10-CM | POA: Diagnosis not present

## 2023-08-10 DIAGNOSIS — M5459 Other low back pain: Secondary | ICD-10-CM

## 2023-08-10 NOTE — Patient Instructions (Signed)
 Access Code: [email or web address] URL: [email or web address] Date: 08/10/2023 Prepared by: Leah Primus  Exercises - Clam with Resistance  - 1 x daily - 3 sets - 10 reps - Supine Posterior Pelvic Tilt  - 1 x daily - 2 sets - 10 reps - 5seconds hold - Straight Leg Raise  - 1 x daily - 3 sets - 10 reps

## 2023-08-15 ENCOUNTER — Telehealth: Payer: Self-pay | Admitting: Neurology

## 2023-08-15 ENCOUNTER — Ambulatory Visit
Admission: RE | Admit: 2023-08-15 | Discharge: 2023-08-15 | Disposition: A | Discharge status: Home / Self Care | Source: Ambulatory Visit | Attending: Acute Care | Admitting: Acute Care

## 2023-08-15 DIAGNOSIS — Z87891 Personal history of nicotine dependence: Secondary | ICD-10-CM

## 2023-08-15 DIAGNOSIS — Z122 Encounter for screening for malignant neoplasm of respiratory organs: Secondary | ICD-10-CM

## 2023-08-15 NOTE — Telephone Encounter (Signed)
 [facility] pending

## 2023-08-17 NOTE — Telephone Encounter (Signed)
 [employer] denied the NPSG see below for the reason.   HST [employer] no auth req.

## 2023-08-25 ENCOUNTER — Ambulatory Visit (INDEPENDENT_AMBULATORY_CARE_PROVIDER_SITE_OTHER): Admitting: Neurology

## 2023-08-25 DIAGNOSIS — R519 Headache, unspecified: Secondary | ICD-10-CM

## 2023-08-25 DIAGNOSIS — G4733 Obstructive sleep apnea (adult) (pediatric): Secondary | ICD-10-CM

## 2023-08-25 DIAGNOSIS — R002 Palpitations: Secondary | ICD-10-CM

## 2023-08-25 DIAGNOSIS — G2581 Restless legs syndrome: Secondary | ICD-10-CM

## 2023-08-25 DIAGNOSIS — R400 Somnolence: Secondary | ICD-10-CM

## 2023-08-25 DIAGNOSIS — R0683 Snoring: Secondary | ICD-10-CM

## 2023-08-25 DIAGNOSIS — F519 Sleep disorder not due to a substance or known physiological condition, unspecified: Secondary | ICD-10-CM

## 2023-08-25 DIAGNOSIS — R351 Nocturia: Secondary | ICD-10-CM

## 2023-08-25 DIAGNOSIS — G478 Other sleep disorders: Secondary | ICD-10-CM

## 2023-08-31 ENCOUNTER — Ambulatory Visit: Admitting: Family Medicine

## 2023-09-06 ENCOUNTER — Encounter (INDEPENDENT_AMBULATORY_CARE_PROVIDER_SITE_OTHER): Payer: Self-pay | Admitting: Family Medicine

## 2023-09-06 ENCOUNTER — Ambulatory Visit (INDEPENDENT_AMBULATORY_CARE_PROVIDER_SITE_OTHER): Admitting: Family Medicine

## 2023-09-06 VITALS — BP 97/67 | HR 72 | Temp 98.3°F | Ht 61.0 in | Wt 220.0 lb

## 2023-09-06 DIAGNOSIS — E669 Obesity, unspecified: Secondary | ICD-10-CM | POA: Diagnosis not present

## 2023-09-06 DIAGNOSIS — G8929 Other chronic pain: Secondary | ICD-10-CM

## 2023-09-06 DIAGNOSIS — Z6841 Body Mass Index (BMI) 40.0 and over, adult: Secondary | ICD-10-CM | POA: Diagnosis not present

## 2023-09-06 DIAGNOSIS — M545 Low back pain, unspecified: Secondary | ICD-10-CM | POA: Diagnosis not present

## 2023-09-06 DIAGNOSIS — E66813 Obesity, class 3: Secondary | ICD-10-CM

## 2023-09-06 NOTE — Progress Notes (Signed)
 Office: [phone]  /  Fax: [phone]  WEIGHT SUMMARY AND BIOMETRICS  Anthropometric Measurements Height: 5\' 1"  (1.549 m) Weight: 220 lb (99.8 kg) BMI (Calculated): 41.59 Weight at Last Visit: 223 lb Weight Lost Since Last Visit: 3 lb Weight Gained Since Last Visit: 0 Starting Weight: 230 lb Total Weight Loss (lbs): 10 lb (4.536 kg) Peak Weight: 230 lb   Body Composition  Body Fat %: 47.2 % Fat Mass (lbs): 104 lbs Muscle Mass (lbs): 110.4 lbs Total Body Water (lbs): 84.6 lbs Visceral Fat Rating : 15   Other Clinical Data RMR: 1915 Fasting: no Labs: no Today's Visit #: 4 Starting Date: 06/29/23    Chief Complaint: OBESITY    History of Present Illness Betty Snyder is a 58 year old female who presents for a follow-up on her obesity treatment plan.  She is adhering to a category two eating plan, fluctuating between category two and three about ninety percent of the time. She has incorporated exercise into her routine, specifically walking for about ten minutes, five times a week, resulting in a three-pound weight loss over the past month.  Her hunger is manageable, and her body is adjusting to the new eating plan. She is practicing portion control and meal planning effectively. Occasionally, she indulges in foods like 'blooming onion' but monitors her portions carefully.  She attempts to walk during work breaks, as exercising after returning home is challenging. She has increased her walking from two laps to three laps around the building. She prefers walking outside but will walk inside when necessary, although she finds it less enjoyable.  She experiences chronic bilateral low back pain, which is not exacerbated by walking, though she had a recent flare-up after turning incorrectly. She uses a heating pad for relief and finds that a muscle relaxer at bedtime is helpful. She previously tried physical therapy but did not find it beneficial and does not perform  the exercises at home.      PHYSICAL EXAM:  Blood pressure 97/67, pulse 72, temperature 98.3 F (36.8 C), height 5\' 1"  (1.549 m), weight 220 lb (99.8 kg), last menstrual period 10/22/2013, SpO2 95%. Body mass index is 41.57 kg/m.  DIAGNOSTIC DATA REVIEWED:  BMET    Component Value Date/Time   NA 138 06/29/2023 0844   K 4.9 06/29/2023 0844   CL 100 06/29/2023 0844   CO2 22 06/29/2023 0844   GLUCOSE 103 (H) 06/29/2023 0844   GLUCOSE 131 (H) 05/22/2023 0909   BUN 13 06/29/2023 0844   CREATININE 0.76 06/29/2023 0844   CALCIUM  9.7 06/29/2023 0844   GFRNONAA 92 02/18/2019 1112   GFRAA 106 02/18/2019 1112   Lab Results  Component Value Date   HGBA1C 6.6 (H) 06/29/2023   HGBA1C 5.9 02/18/2016   Lab Results  Component Value Date   INSULIN  38.0 (H) 06/29/2023   INSULIN  8.3 05/08/2018   Lab Results  Component Value Date   TSH 2.590 06/29/2023   CBC    Component Value Date/Time   WBC 9.1 06/29/2023 0844   WBC 7.6 12/22/2021 0747   RBC 5.21 06/29/2023 0844   RBC 4.80 12/22/2021 0747   HGB 15.3 06/29/2023 0844   HCT 46.7 (H) 06/29/2023 0844   PLT 269 06/29/2023 0844   MCV 90 06/29/2023 0844   MCH 29.4 06/29/2023 0844   MCH 29.1 11/30/2018 0835   MCHC 32.8 06/29/2023 0844   MCHC 33.3 12/22/2021 0747   RDW 13.3 06/29/2023 0844   Iron Studies No  results found for: "IRON", "TIBC", "FERRITIN", "IRONPCTSAT" Lipid Panel     Component Value Date/Time   CHOL 139 06/29/2023 0844   TRIG 172 (H) 06/29/2023 0844   HDL 39 (L) 06/29/2023 0844   CHOLHDL 5 05/22/2023 0909   VLDL 77.6 (H) 05/22/2023 0909   LDLCALC 71 06/29/2023 0844   LDLCALC 82 03/03/2021 1630   LDLDIRECT 118.0 05/22/2023 0909   Hepatic Function Panel     Component Value Date/Time   PROT 7.5 06/29/2023 0844   ALBUMIN 4.8 06/29/2023 0844   AST 32 06/29/2023 0844   ALT 37 (H) 06/29/2023 0844   ALKPHOS 91 06/29/2023 0844   BILITOT 0.4 06/29/2023 0844      Component Value Date/Time   TSH 2.590  06/29/2023 0844   Nutritional Lab Results  Component Value Date   VD25OH 37.7 06/29/2023   VD25OH 25.64 (L) 05/22/2023   VD25OH 39.06 12/22/2021     Assessment and Plan Assessment & Plan Chronic bilateral low back pain Chronic bilateral low back pain persists, possibly due to a pinched nerve, as x-rays have not shown structural issues. Physical therapy was ineffective, and she is non-compliant with home exercises. Walking provides some relief, and a heating pad and muscle relaxers are beneficial. Further evaluation by Doctor Alease Hunter is recommended to explore additional treatment options. Insurance coverage for further treatments may require documentation of previous physical therapy attempts. - Follow up with Doctor Alease Hunter for further evaluation and treatment options - Use heating pad as needed for pain relief - Continue walking as tolerated, avoiding overexertion - Use muscle relaxers at bedtime as needed  Obesity Obesity management focuses on dietary modifications and increased physical activity. She follows a category two eating plan and occasionally a category three plan, with successful portion control and meal planning. She has lost three pounds in the last month. She exercises by walking about ten minutes, five times per week. Hunger levels are appropriate, and she incorporates enjoyable foods in moderation to prevent deprivation. - Continue category two eating plan with portion control - Encourage regular physical activity, aiming to increase walking duration gradually - Consider alternative indoor walking options during inclement weather     I have personally spent 30 minutes total time today in preparation, patient care, and documentation for this visit, including the following: review of clinical lab tests; review of medical history, review of dietary habits and customized nutritional counseling   She was informed of the importance of frequent follow up visits to maximize her  success with intensive lifestyle modifications for her multiple health conditions.    Jasmine Mesi, MD

## 2023-09-07 NOTE — Progress Notes (Unsigned)
 Betty Snyder

## 2023-09-08 ENCOUNTER — Ambulatory Visit: Payer: Self-pay | Admitting: Neurology

## 2023-09-08 DIAGNOSIS — F519 Sleep disorder not due to a substance or known physiological condition, unspecified: Secondary | ICD-10-CM | POA: Insufficient documentation

## 2023-09-08 DIAGNOSIS — R002 Palpitations: Secondary | ICD-10-CM | POA: Insufficient documentation

## 2023-09-08 DIAGNOSIS — G478 Other sleep disorders: Secondary | ICD-10-CM | POA: Insufficient documentation

## 2023-09-08 DIAGNOSIS — R519 Headache, unspecified: Secondary | ICD-10-CM | POA: Insufficient documentation

## 2023-09-08 NOTE — Procedures (Signed)
 [facility]   HOME SLEEP TEST REPORT ( by Katheen Palma  mail -out device )   STUDY DATE:   Data received : 09-07-2023 Betty Snyder 58 year old female 26-Oct-1965   ORDERING CLINICIAN:  Neomia Banner, MD  REFERRING CLINICIAN: Jasmine Mesi, MD    CLINICAL INFORMATION/HISTORY:Betty Snyder is a 58 y.o. female patient who is seen upon referral from Treasure Friendly, MD at [facility]. Seen on 08/08/2023 for non-restorative  non refreshing sleep. Chief concern according to patient :  "I am fighting weight for over 5 years , I had seen dr Alvia Awkward before the pandemic , was losing weight and the pandemic caused me to lose my job, insurance and access to health care. "  " I wake myself up from snoring and choking, SOB and now started sleep talking ". The patient goes to bed at 10 PM and feels her RLS arising. She continues to sleep for a total of 6 -7 hours, wakes for many bathroom breaks, wakes herself  from snoring ,too".   1) excessive daytime sleepiness  and RLS , in a patient with high risk factors for ODA by BMI ( 35 ) , neck size (18") and airway anatomy.  2) severely fragmented sleep, due to choking, snoring and nocturia.  3) Delayed sleep onset due to RLS.  4) sleep talking , vivid dreams, active dreams. 5) former smoker.   Epworth sleepiness score: 12/ 24 points   FSS endorsed at 37/ 63 points.  BMI: 35 kg/m Neck Circumference: 18"   FINDINGS:  Sleep Summary:   Total Recording Time (hours, min):     9 h 17 m   Total Sleep Time (hours, min):   7 h 34 minutes  Sleep efficiency %;       82%                                Respiratory Indices by AASM  scoring guidelines;    Calculated pAHI (per hour):    49.3/h                                              Positional  respiratory activity  / snoring : Moderate snoring volume at baseline with peak volumes of over 60 dB ( loud) , snoring over 80% of the recorded sleep time.   Oxygen Saturation  in Sleep     Oxygen Saturation (%) Mean:    89% ( low)                O2 Saturation Range (%):  Between a nadir at 50% (!) and the highest 02 saturation at 97%                                     O2 Saturation (minutes) <89%:  142 minutes - severe          Pulse Rate in Sleep :   Pulse Mean (bpm): 65 bpm                 Pulse Range: between 53 and 114 bpm  , normal sinus rhythm with isolated PVCs.  IMPRESSION:  This HST confirms the presence of severe obstructive type sleep apnea associated with severe hypoxemia of sleep. The nadir and amount of total sleep time in hypoxia are very concerning. High AHI was seen independent of sleep position.  Weight loss should be part of the long term improvement of sleep apnea, a BMI of 32 or less usually addresses the hypoventilation aspect of apnea.   Sleeping in a recliner may help to improve air exchange.        RECOMMENDATION: order for immediate start of auto- CPAP therapy ( 6 through 18 cm water 2 cm EPR, heated humidification, and mask to be fitted)  and additionally, I placed an order for ONO while on CPAP- we want to make sure that no central apnea arises and that hypoxia is addressed sufficiently by PAP therapy.  I like to follow up within 60 days of therapy - that visit can be virtual, with NP or me.       INTERPRETING PHYSICIAN:   Neomia Banner, MD  [facility] Sleep Board certified by [employer] Sleep Medicine and Diplomate of the [employer] of Sleep Medicine. Board certified In Neurology through the ABPN, Fellow of the [employer] of Neu[employer].

## 2023-09-11 ENCOUNTER — Other Ambulatory Visit: Payer: Self-pay

## 2023-09-11 DIAGNOSIS — Z87891 Personal history of nicotine dependence: Secondary | ICD-10-CM

## 2023-09-11 DIAGNOSIS — Z122 Encounter for screening for malignant neoplasm of respiratory organs: Secondary | ICD-10-CM

## 2023-09-12 NOTE — Telephone Encounter (Signed)
 Patient would like a call back to discuss sleep study results.

## 2023-09-27 ENCOUNTER — Encounter (INDEPENDENT_AMBULATORY_CARE_PROVIDER_SITE_OTHER): Payer: Self-pay | Admitting: Family Medicine

## 2023-09-27 ENCOUNTER — Ambulatory Visit (INDEPENDENT_AMBULATORY_CARE_PROVIDER_SITE_OTHER): Admitting: Family Medicine

## 2023-09-27 VITALS — BP 132/72 | HR 80 | Temp 98.4°F | Ht 61.0 in | Wt 216.0 lb

## 2023-09-27 DIAGNOSIS — E66813 Obesity, class 3: Secondary | ICD-10-CM

## 2023-09-27 DIAGNOSIS — E669 Obesity, unspecified: Secondary | ICD-10-CM

## 2023-09-27 DIAGNOSIS — E1169 Type 2 diabetes mellitus with other specified complication: Secondary | ICD-10-CM

## 2023-09-27 DIAGNOSIS — M549 Dorsalgia, unspecified: Secondary | ICD-10-CM

## 2023-09-27 DIAGNOSIS — Z6841 Body Mass Index (BMI) 40.0 and over, adult: Secondary | ICD-10-CM

## 2023-09-27 DIAGNOSIS — I700 Atherosclerosis of aorta: Secondary | ICD-10-CM

## 2023-09-27 DIAGNOSIS — Z87891 Personal history of nicotine dependence: Secondary | ICD-10-CM

## 2023-09-27 DIAGNOSIS — E785 Hyperlipidemia, unspecified: Secondary | ICD-10-CM | POA: Diagnosis not present

## 2023-09-27 NOTE — Progress Notes (Signed)
 Office: [phone]  /  Fax: [phone]  WEIGHT SUMMARY AND BIOMETRICS  Anthropometric Measurements Height: 5' 1 (1.549 m) Weight: 216 lb (98 kg) BMI (Calculated): 40.83 Weight at Last Visit: 220 lb Weight Lost Since Last Visit: 4 lb Weight Gained Since Last Visit: 0 Starting Weight: 230 lb Total Weight Loss (lbs): 14 lb (6.35 kg) Peak Weight: 230 lb Waist Measurement : 50 inches   Body Composition  Body Fat %: 47.2 % Fat Mass (lbs): 102.2 lbs Muscle Mass (lbs): 108.6 lbs Total Body Water (lbs): 85.2 lbs Visceral Fat Rating : 15   Other Clinical Data RMR: 1915 Fasting: no Labs: no Today's Visit #: 5 Starting Date: 06/29/23    Chief Complaint: OBESITY    History of Present Illness Betty Snyder is a 58 year old female who presents for a follow-up on her obesity treatment plan.  She is adhering to a combination of category two and category three eating plan with 95% adherence, resulting in a weight loss of four pounds over the last three weeks. She engages in physical activity by walking ten minutes every other day, although her schedule and weather conditions sometimes affect her ability to walk outdoors, leading her to opt for indoor walking.  She experiences back pain, which has improved with walking but is not completely resolved. She also mentions having shin splints, which improve with increased activity. She experiences stiffness in her hips when walking, which she attributes to either her age or resuming physical activity.  She is currently taking Lipitor and reports no issues with the medication.  A CT scan for lung screening last month showed sclerotic calcification of the aorta and involvement of the left anterior descending coronary artery. She is concerned due to her sister's recent triple bypass surgery.  She has a history of smoking, which she acknowledges increases her risk for plaque buildup. Her sister, who is ten years younger, recently  underwent a triple bypass surgery.      PHYSICAL EXAM:  Blood pressure 132/72, pulse 80, temperature 98.4 F (36.9 C), height 5' 1 (1.549 m), weight 216 lb (98 kg), last menstrual period 10/22/2013, SpO2 96%. Body mass index is 40.81 kg/m.  DIAGNOSTIC DATA REVIEWED:  BMET    Component Value Date/Time   NA 138 06/29/2023 0844   K 4.9 06/29/2023 0844   CL 100 06/29/2023 0844   CO2 22 06/29/2023 0844   GLUCOSE 103 (H) 06/29/2023 0844   GLUCOSE 131 (H) 05/22/2023 0909   BUN 13 06/29/2023 0844   CREATININE 0.76 06/29/2023 0844   CALCIUM  9.7 06/29/2023 0844   GFRNONAA 92 02/18/2019 1112   GFRAA 106 02/18/2019 1112   Lab Results  Component Value Date   HGBA1C 6.6 (H) 06/29/2023   HGBA1C 5.9 02/18/2016   Lab Results  Component Value Date   INSULIN  38.0 (H) 06/29/2023   INSULIN  8.3 05/08/2018   Lab Results  Component Value Date   TSH 2.590 06/29/2023   CBC    Component Value Date/Time   WBC 9.1 06/29/2023 0844   WBC 7.6 12/22/2021 0747   RBC 5.21 06/29/2023 0844   RBC 4.80 12/22/2021 0747   HGB 15.3 06/29/2023 0844   HCT 46.7 (H) 06/29/2023 0844   PLT 269 06/29/2023 0844   MCV 90 06/29/2023 0844   MCH 29.4 06/29/2023 0844   MCH 29.1 11/30/2018 0835   MCHC 32.8 06/29/2023 0844   MCHC 33.3 12/22/2021 0747   RDW 13.3 06/29/2023 0844   Iron Studies No  results found for: IRON, TIBC, FERRITIN, IRONPCTSAT Lipid Panel     Component Value Date/Time   CHOL 139 06/29/2023 0844   TRIG 172 (H) 06/29/2023 0844   HDL 39 (L) 06/29/2023 0844   CHOLHDL 5 05/22/2023 0909   VLDL 77.6 (H) 05/22/2023 0909   LDLCALC 71 06/29/2023 0844   LDLCALC 82 03/03/2021 1630   LDLDIRECT 118.0 05/22/2023 0909   Hepatic Function Panel     Component Value Date/Time   PROT 7.5 06/29/2023 0844   ALBUMIN 4.8 06/29/2023 0844   AST 32 06/29/2023 0844   ALT 37 (H) 06/29/2023 0844   ALKPHOS 91 06/29/2023 0844   BILITOT 0.4 06/29/2023 0844      Component Value Date/Time   TSH  2.590 06/29/2023 0844   Nutritional Lab Results  Component Value Date   VD25OH 37.7 06/29/2023   VD25OH 25.64 (L) 05/22/2023   VD25OH 39.06 12/22/2021     Assessment and Plan Assessment & Plan Aortic Atherosclerosis Sclerotic calcification of the aorta and involvement of the left anterior descending coronary artery with mild cardiomegaly. She is concerned due to family history of heart disease. Discussed stable versus unstable plaques, emphasizing Lipitor's role in stabilizing plaques to reduce the risk of sudden cardiac events. Reassured her about the current treatment plan and the importance of lifestyle modifications. - Continue Lipitor as prescribed - Continue lifestyle modifications including diet and exercise - Annual low-dose CT scan for lung cancer screening  Hyperlipidemia Currently on Lipitor with no reported myalgia, a potential side effect due to CoQ10 depletion. Discussed Lipitor's role in plaque stabilization and potential dosage reduction with weight loss and lifestyle changes. Advised to monitor for myalgia and consider CoQ10 supplementation if symptoms develop. - Continue Lipitor as prescribed - Monitor for myalgia and consider CoQ10 supplementation if symptoms develop  Obesity Following a combination of category two and three eating plans with 95% adherence, resulting in a four-pound weight loss over three weeks. Engaging in ten minutes of walking every other day and incorporating healthier food options. Introduced [employer] for exercise. Current plan is effective, and continued adherence is encouraged. - Continue current eating plan with 95% adherence - Continue walking ten minutes every other day - Incorporate [employer]'s indoor walking videos for exercise  Back Pain Improvement in back pain with walking and reduced sitting. Advised to avoid overexertion and to stop if pain worsens. - Continue walking as tolerated - Avoid  overexertion and stop if back pain worsens  General Health Maintenance Former smoker undergoing annual low-dose CT scans for lung cancer screening. Emphasized maintaining a healthy lifestyle to manage conditions. - Continue annual low-dose CT scan for lung cancer screening  Follow-up Follow-up appointment scheduled for July 22nd with advice to schedule another in August. - Attend follow-up appointment on July 22nd - Schedule follow-up appointment in August     I have personally spent 32 minutes total time today in preparation, patient care, and documentation for this visit, including the following: review of clinical lab tests; review of medical history, review of CT scan with the patient and in-depth explanation of CAD and modifiable vs non-modifiable risk factors   She was informed of the importance of frequent follow up visits to maximize her success with intensive lifestyle modifications for her multiple health conditions.    Betty Mesi, MD

## 2023-10-31 ENCOUNTER — Ambulatory Visit (INDEPENDENT_AMBULATORY_CARE_PROVIDER_SITE_OTHER): Admitting: Family Medicine

## 2023-10-31 ENCOUNTER — Encounter (INDEPENDENT_AMBULATORY_CARE_PROVIDER_SITE_OTHER): Payer: Self-pay | Admitting: Family Medicine

## 2023-10-31 VITALS — BP 116/61 | HR 84 | Temp 98.3°F | Ht 61.0 in | Wt 217.0 lb

## 2023-10-31 DIAGNOSIS — Z6841 Body Mass Index (BMI) 40.0 and over, adult: Secondary | ICD-10-CM | POA: Diagnosis not present

## 2023-10-31 DIAGNOSIS — E66813 Obesity, class 3: Secondary | ICD-10-CM

## 2023-10-31 DIAGNOSIS — E1169 Type 2 diabetes mellitus with other specified complication: Secondary | ICD-10-CM

## 2023-10-31 DIAGNOSIS — E119 Type 2 diabetes mellitus without complications: Secondary | ICD-10-CM | POA: Diagnosis not present

## 2023-10-31 DIAGNOSIS — E669 Obesity, unspecified: Secondary | ICD-10-CM

## 2023-10-31 MED ORDER — TIRZEPATIDE 2.5 MG/0.5ML ~~LOC~~ SOAJ
2.5000 mg | SUBCUTANEOUS | 0 refills | Status: DC
Start: 2023-10-31 — End: 2023-11-29

## 2023-10-31 NOTE — Progress Notes (Signed)
 Office: [phone]  /  Fax: [phone]  WEIGHT SUMMARY AND BIOMETRICS  Anthropometric Measurements Height: 5' 1 (1.549 m) Weight: 217 lb (98.4 kg) BMI (Calculated): 41.02 Weight at Last Visit: 216 lb Weight Lost Since Last Visit: 0 Weight Gained Since Last Visit: 1 lb Starting Weight: 230 lb Total Weight Loss (lbs): 13 lb (5.897 kg) Peak Weight: 230 lb Waist Measurement : 50 inches   Body Composition  Body Fat %: 47.3 % Fat Mass (lbs): 103 lbs Muscle Mass (lbs): 109 lbs Total Body Water (lbs): 85.8 lbs Visceral Fat Rating : 15   Other Clinical Data RMR: 1915 Fasting: no Labs: no Today's Visit #: 6 Starting Date: 06/29/23    Chief Complaint: OBESITY    History of Present Illness Betty Snyder is a 58 year old female with obesity and type 2 diabetes who presents for obesity treatment and progress assessment.  [name], occasionally reducing to a category two plan, and adheres to it 95% of the time. She exercises by walking for five minutes four days a week and has gained one pound in the last month. [name] to choose low-calorie options when dining out with family, despite efforts to select healthier meals like a chicken Caesar salad.  [name], she relies on diet control, with her last A1c recorded at 6.8. [name]. [name] but has a glucometer available. [name]. [name] due to concerns about weight gain, as experienced by her brother.  She experiences occasional back irritation during her walks but finds it manageable. She has increased her walking to five minutes twice a day on some days. She does not fast regularly but is open to fasting for future lab work. [name], with her mother encouraging her weight loss  efforts.      PHYSICAL EXAM:  Blood pressure 116/61, pulse 84, temperature 98.3 F (36.8 C), height 5' 1 (1.549 m), weight 217 lb (98.4 kg), last menstrual period 10/22/2013, SpO2 94%. Body mass index is 41 kg/m.  DIAGNOSTIC DATA REVIEWED:  BMET    Component Value Date/Time   NA 138 06/29/2023 0844   K 4.9 06/29/2023 0844   CL 100 06/29/2023 0844   CO2 22 06/29/2023 0844   GLUCOSE 103 (H) 06/29/2023 0844   GLUCOSE 131 (H) 05/22/2023 0909   BUN 13 06/29/2023 0844   CREATININE 0.76 06/29/2023 0844   CALCIUM  9.7 06/29/2023 0844   GFRNONAA 92 02/18/2019 1112   GFRAA 106 02/18/2019 1112   Lab Results  Component Value Date   HGBA1C 6.6 (H) 06/29/2023   HGBA1C 5.9 02/18/2016   Lab Results  Component Value Date   INSULIN  38.0 (H) 06/29/2023   INSULIN  8.3 05/08/2018   Lab Results  Component Value Date   TSH 2.590 06/29/2023   CBC    Component Value Date/Time   WBC 9.1 06/29/2023 0844   WBC 7.6 12/22/2021 0747   RBC 5.21 06/29/2023 0844   RBC 4.80 12/22/2021 0747   HGB 15.3 06/29/2023 0844   HCT 46.7 (H) 06/29/2023 0844   PLT 269 06/29/2023 0844   MCV 90 06/29/2023 0844   MCH 29.4 06/29/2023 0844   MCH 29.1 11/30/2018 0835   MCHC 32.8 06/29/2023 0844   MCHC 33.3 12/22/2021 0747  RDW 13.3 06/29/2023 0844   Iron Studies No results found for: IRON, TIBC, FERRITIN, IRONPCTSAT Lipid Panel     Component Value Date/Time   CHOL 139 06/29/2023 0844   TRIG 172 (H) 06/29/2023 0844   HDL 39 (L) 06/29/2023 0844   CHOLHDL 5 05/22/2023 0909   VLDL 77.6 (H) 05/22/2023 0909   LDLCALC 71 06/29/2023 0844   LDLCALC 82 03/03/2021 1630   LDLDIRECT 118.0 05/22/2023 0909   Hepatic Function Panel     Component Value Date/Time   PROT 7.5 06/29/2023 0844   ALBUMIN 4.8 06/29/2023 0844   AST 32 06/29/2023 0844   ALT 37 (H) 06/29/2023 0844   ALKPHOS 91 06/29/2023 0844   BILITOT 0.4 06/29/2023 0844      Component Value Date/Time   TSH 2.590 06/29/2023 0844    Nutritional Lab Results  Component Value Date   VD25OH 37.7 06/29/2023   VD25OH 25.64 (L) 05/22/2023   VD25OH 39.06 12/22/2021     Assessment and Plan Assessment & Plan Type 2 Diabetes Mellitus Her type 2 diabetes is managed through diet control with an A1c of 6.8. [name] due to concerns about weight gain. Discussed the liver's role in glucose production and benefits of medications reducing hepatic glucose output. [name] aiding diabetes management, specifically GLP-1 receptor agonists, which help with weight loss and improve glycemic control by reducing hepatic glucose production and increasing insulin  sensitivity. - Check fasting blood glucose biweekly and postprandial glucose 1-2 hours after dinner - Initiate prior authorization for GLP-1 receptor agonist - Discuss benefits of GLP-1 receptor agonists in diabetes management  Obesity [name] 95% of the time, with a one-pound weight gain since the last visit. She exercises by walking five minutes, four days a week. Challenges include managing calorie intake when dining out and dealing with temptations. Guidance on meal choices emphasized protein intake and calorie management. She is interested in GLP-1 receptor agonists for weight loss, which reduce hunger and slow gastric emptying. Potential side effects include constipation and gastrointestinal discomfort. The medication requires prior authorization, which may take up to two weeks. - Provide eating out handout - Encourage increasing walking to five days a week, five minutes each day - Initiate prior authorization for GLP-1 receptor agonist - Discuss potential side effects and management strategies for GLP-1 receptor agonist  General Health Maintenance [name], including dietary changes and increased physical activity, to manage obesity  and type 2 diabetes. Guidance was provided on meal planning and exercise. - Highlight preferred meal options from the eating out handout - Advise checking nutritional information of restaurant meals online - Encourage increasing physical activity as tolerated  Follow-up She has a follow-up appointment on August 20th and should be fasting for lab work. Suggested scheduling a September appointment. - Ensure fasting for lab work at the next appointment on August 20th - Schedule a follow-up appointment in September      She was informed of the importance of frequent follow up visits to maximize her success with intensive lifestyle modifications for her multiple health conditions.    Louann Penton, MD

## 2023-11-13 ENCOUNTER — Telehealth: Payer: Self-pay | Admitting: Neurology

## 2023-11-13 DIAGNOSIS — G478 Other sleep disorders: Secondary | ICD-10-CM

## 2023-11-13 DIAGNOSIS — R0902 Hypoxemia: Secondary | ICD-10-CM

## 2023-11-13 DIAGNOSIS — R519 Headache, unspecified: Secondary | ICD-10-CM

## 2023-11-13 DIAGNOSIS — F519 Sleep disorder not due to a substance or known physiological condition, unspecified: Secondary | ICD-10-CM

## 2023-11-13 DIAGNOSIS — R0683 Snoring: Secondary | ICD-10-CM

## 2023-11-13 DIAGNOSIS — R002 Palpitations: Secondary | ICD-10-CM

## 2023-11-13 DIAGNOSIS — G2581 Restless legs syndrome: Secondary | ICD-10-CM

## 2023-11-13 DIAGNOSIS — R351 Nocturia: Secondary | ICD-10-CM

## 2023-11-13 NOTE — Telephone Encounter (Signed)
 Upon review Dr Chalice agrees that oxygen would be needed for the pt. Will need to get the patient in for a cpap titration and titrate oxygen into CPAP

## 2023-11-13 NOTE — Addendum Note (Signed)
 Addended by: DELFINO AUGUSTIN BROCKS on: 11/13/2023 04:52 PM   Modules accepted: Orders

## 2023-11-13 NOTE — Telephone Encounter (Signed)
 Received the Ono on CPAP report completed 11/09/2023-11/10/2023.  The test had a recording of 7 hours 22 minutes and 43 seconds.  The lowest the oxygen level dropped was 77%.  There was a total of 1 hour 17 minutes and 15 seconds where the oxygen was spent below 89% as well as a total of 51 minutes and 4 seconds where the oxygen was below 88%.  Will send the report to Dr. Chalice to review for the patient.

## 2023-11-13 NOTE — Telephone Encounter (Signed)
 Spoke w/Pt gave results of ONO on CPAP study and informed Pt of recommendation of oxygen needed and sleep lab will reach out to Pt for CPAP titration and titrate oxygen into CPAP. Pt voiced understanding and thanks for the call.

## 2023-11-20 ENCOUNTER — Telehealth: Payer: Self-pay | Admitting: Neurology

## 2023-11-20 NOTE — Telephone Encounter (Signed)
 CPAP [employer] shara: [id number] (exp. 11/20/23 to 02/27/24)  Patient is scheduled at [facility] for Friday 12/29/2023 at 8 pm.  Mailed packet and sent mychart.  Patient stated she did not know anything about this CPAP study and would like a call back about her ONO results. She stated when she received the call it was in the middle of her dinner time. She did not get the information.

## 2023-11-20 NOTE — Telephone Encounter (Signed)
 Spoke w/Pt regarding the sleep study results and ONO titration study. Pt stated she remembers speaking with our office about the results but then someone called her about the titration study and she told them she was having dinner and could they call back about 7pm. Pt stated she didn't get a call back. Affirmed with Pt the study is with oxygen to see if it makes a difference in her oxygen level. Pt voiced understanding and said someone caller her and it is scheduled for 12/29/23. Pt stated she needs to speak with someone about her nose mask. Advised Pt to reach out to her DME provider [employer], and does she need the number. Pt stated she has the number and will call them. Pt stated her nose is dry and sometimes she it bleeds. Recommended nasal spray to use to help with dry nose. Pt voiced understanding and thanks for the call.

## 2023-11-29 ENCOUNTER — Encounter (INDEPENDENT_AMBULATORY_CARE_PROVIDER_SITE_OTHER): Payer: Self-pay | Admitting: Family Medicine

## 2023-11-29 ENCOUNTER — Ambulatory Visit (INDEPENDENT_AMBULATORY_CARE_PROVIDER_SITE_OTHER): Admitting: Family Medicine

## 2023-11-29 VITALS — BP 104/71 | HR 72 | Temp 98.1°F | Ht 61.0 in | Wt 213.0 lb

## 2023-11-29 DIAGNOSIS — E119 Type 2 diabetes mellitus without complications: Secondary | ICD-10-CM | POA: Diagnosis not present

## 2023-11-29 DIAGNOSIS — E559 Vitamin D deficiency, unspecified: Secondary | ICD-10-CM

## 2023-11-29 DIAGNOSIS — E669 Obesity, unspecified: Secondary | ICD-10-CM | POA: Diagnosis not present

## 2023-11-29 DIAGNOSIS — E1169 Type 2 diabetes mellitus with other specified complication: Secondary | ICD-10-CM

## 2023-11-29 DIAGNOSIS — Z7985 Long-term (current) use of injectable non-insulin antidiabetic drugs: Secondary | ICD-10-CM

## 2023-11-29 DIAGNOSIS — E785 Hyperlipidemia, unspecified: Secondary | ICD-10-CM | POA: Diagnosis not present

## 2023-11-29 DIAGNOSIS — Z6841 Body Mass Index (BMI) 40.0 and over, adult: Secondary | ICD-10-CM

## 2023-11-29 MED ORDER — TIRZEPATIDE 2.5 MG/0.5ML ~~LOC~~ SOAJ: 2.5000 mg | SUBCUTANEOUS | 0 refills | Status: DC

## 2023-11-29 NOTE — Progress Notes (Signed)
 Office: [phone]  /  Fax: [phone]  WEIGHT SUMMARY AND BIOMETRICS  Anthropometric Measurements Height: 5' 1 (1.549 m) Weight: 213 lb (96.6 kg) BMI (Calculated): 40.27 Weight at Last Visit: 217 lb Weight Lost Since Last Visit: 4 lb Weight Gained Since Last Visit: 0 Starting Weight: 230 lb Total Weight Loss (lbs): 17 lb (7.711 kg) Peak Weight: 230 lb Waist Measurement : 50 inches   Body Composition  Body Fat %: 46.3 % Fat Mass (lbs): 99 lbs Muscle Mass (lbs): 109 lbs Total Body Water (lbs): 83.4 lbs Visceral Fat Rating : 15   Other Clinical Data RMR: 1915 Fasting: yes Labs: no Today's Visit #: 7 Starting Date: 06/29/23    Chief Complaint: OBESITY   History of Present Illness Betty Snyder is a 58 year old female with obesity and type 2 diabetes who presents for obesity treatment plan assessment and progress evaluation.  She is adhering to a category two eating plan approximately ninety percent of the time and engages in physical activity two to three days a week, walking for ten minutes each session. She has achieved a weight loss of four pounds over the past month, with three pounds attributed to fat loss and one pound to water loss.  She is currently managing her type 2 diabetes with Mounjaro  2.5 mg. Her fasting blood sugar level is 105 mg/dL and her postprandial level is 93 mg/dL. She is fasting today for lab work and is interested in understanding more about her nutritional needs, including calorie and protein intake, to support her weight loss and diabetes management.  She discusses her dietary habits, including occasional meal skipping on weekends and her interest in protein-rich foods. She wants to maintain a calorie intake between 1100 to 1300 calories per day to support weight loss without muscle loss, and to consume approximately 95 grams of protein daily, primarily from food sources rather than supplements. She is interested in protein noodles as an  alternative to regular pasta and discusses strategies for maintaining her diet during an upcoming family beach trip.      PHYSICAL EXAM:  Blood pressure 104/71, pulse 72, temperature 98.1 F (36.7 C), height 5' 1 (1.549 m), weight 213 lb (96.6 kg), last menstrual period 10/22/2013, SpO2 95%. Body mass index is 40.25 kg/m.  DIAGNOSTIC DATA REVIEWED:  BMET    Component Value Date/Time   NA 138 06/29/2023 0844   K 4.9 06/29/2023 0844   CL 100 06/29/2023 0844   CO2 22 06/29/2023 0844   GLUCOSE 103 (H) 06/29/2023 0844   GLUCOSE 131 (H) 05/22/2023 0909   BUN 13 06/29/2023 0844   CREATININE 0.76 06/29/2023 0844   CALCIUM  9.7 06/29/2023 0844   GFRNONAA 92 02/18/2019 1112   GFRAA 106 02/18/2019 1112   Lab Results  Component Value Date   HGBA1C 6.6 (H) 06/29/2023   HGBA1C 5.9 02/18/2016   Lab Results  Component Value Date   INSULIN  38.0 (H) 06/29/2023   INSULIN  8.3 05/08/2018   Lab Results  Component Value Date   TSH 2.590 06/29/2023   CBC    Component Value Date/Time   WBC 9.1 06/29/2023 0844   WBC 7.6 12/22/2021 0747   RBC 5.21 06/29/2023 0844   RBC 4.80 12/22/2021 0747   HGB 15.3 06/29/2023 0844   HCT 46.7 (H) 06/29/2023 0844   PLT 269 06/29/2023 0844   MCV 90 06/29/2023 0844   MCH 29.4 06/29/2023 0844   MCH 29.1 11/30/2018 0835   MCHC 32.8 06/29/2023 0844  MCHC 33.3 12/22/2021 0747   RDW 13.3 06/29/2023 0844   Iron Studies No results found for: IRON, TIBC, FERRITIN, IRONPCTSAT Lipid Panel     Component Value Date/Time   CHOL 139 06/29/2023 0844   TRIG 172 (H) 06/29/2023 0844   HDL 39 (L) 06/29/2023 0844   CHOLHDL 5 05/22/2023 0909   VLDL 77.6 (H) 05/22/2023 0909   LDLCALC 71 06/29/2023 0844   LDLCALC 82 03/03/2021 1630   LDLDIRECT 118.0 05/22/2023 0909   Hepatic Function Panel     Component Value Date/Time   PROT 7.5 06/29/2023 0844   ALBUMIN 4.8 06/29/2023 0844   AST 32 06/29/2023 0844   ALT 37 (H) 06/29/2023 0844   ALKPHOS 91  06/29/2023 0844   BILITOT 0.4 06/29/2023 0844      Component Value Date/Time   TSH 2.590 06/29/2023 0844   Nutritional Lab Results  Component Value Date   VD25OH 37.7 06/29/2023   VD25OH 25.64 (L) 05/22/2023   VD25OH 39.06 12/22/2021     Assessment and Plan Assessment & Plan Obesity Obesity management is ongoing with a focus on dietary adherence and exercise. She is following the category two eating plan 90% of the time and exercises 2-3 days a week for 10 minutes by walking. She has lost 4 pounds in the last month, with 3 pounds being fat and 1 pound water, indicating a positive fat loss ratio. Emphasis on maintaining muscle mass to prevent metabolic slowdown. Discussion on the importance of adequate caloric and protein intake to prevent muscle breakdown and malnutrition, especially with GLP-1 medications like Mounjaro . Risks of malnutrition and muscle breakdown with inadequate intake were discussed, highlighting the need for sufficient calories and protein. - Continue category two eating plan - Exercise 2-3 days a week - Ensure caloric intake between 1100-1300 calories per day - Ensure protein intake of 95 grams per day, with at least 70 grams from real food - Provide list of 100-200 calorie snacks with good protein content - Encourage use of a food journal or app to track deviations from the meal plan  Type 2 diabetes mellitus Type 2 diabetes is being managed with Mounjaro  2.5 mg. Blood sugar levels are well controlled with a fasting level of 105 and postprandial level of 93. Mounjaro  is effective in controlling postprandial blood sugars. Emphasized the importance of maintaining blood sugar levels within target ranges to prevent complications. - Continue Mounjaro  2.5 mg - Monitor blood sugar levels regularly - Order fasting labs to assess blood sugar control - Order urine microalbumin test to monitor for early signs of kidney issues  Hyperlipidemia associated with type 2 diabetes  mellitus Hyperlipidemia is associated with type 2 diabetes. Previous labs showed improved triglyceride levels, though still slightly above goal. Continued focus on diet and weight loss to improve lipid profile. - Order fasting labs to assess cholesterol and triglyceride levels  Vitamin D  deficiency Vitamin D  levels are being monitored as part of her overall health management. Previous levels were low, and supplementation may be necessary based on lab results. - Order fasting labs to assess vitamin D  levels  Suspected fatty liver disease Suspected fatty liver disease is being managed through weight loss and appropriate protein intake. Weight loss is the primary treatment for fatty liver disease, and current management is expected to improve liver health. - Order fasting labs to assess liver enzymes      She was informed of the importance of frequent follow up visits to maximize her success with intensive lifestyle modifications  for her multiple health conditions.    Louann Penton, MD

## 2023-12-01 LAB — CMP14+EGFR
ALT: 32 IU/L (ref 0–32)
AST: 24 IU/L (ref 0–40)
Albumin: 4.3 g/dL (ref 3.8–4.9)
Alkaline Phosphatase: 92 IU/L (ref 44–121)
BUN/Creatinine Ratio: 20 (ref 9–23)
BUN: 16 mg/dL (ref 6–24)
Bilirubin Total: 0.5 mg/dL (ref 0.0–1.2)
CO2: 24 mmol/L (ref 20–29)
Calcium: 9.5 mg/dL (ref 8.7–10.2)
Chloride: 102 mmol/L (ref 96–106)
Creatinine, Ser: 0.8 mg/dL (ref 0.57–1.00)
Globulin, Total: 2.8 g/dL (ref 1.5–4.5)
Glucose: 81 mg/dL (ref 70–99)
Potassium: 5 mmol/L (ref 3.5–5.2)
Sodium: 141 mmol/L (ref 134–144)
Total Protein: 7.1 g/dL (ref 6.0–8.5)
eGFR: 85 mL/min/1.73 (ref >59)

## 2023-12-01 LAB — LIPID PANEL WITH LDL/HDL RATIO
Cholesterol, Total: 108 mg/dL (ref 100–199)
HDL: 34 mg/dL — ABNORMAL LOW (ref >39)
LDL Chol Calc (NIH): 53 mg/dL (ref 0–99)
LDL/HDL Ratio: 1.6 ratio (ref 0.0–3.2)
Triglycerides: 111 mg/dL (ref 0–149)
VLDL Cholesterol Cal: 21 mg/dL (ref 5–40)

## 2023-12-01 LAB — VITAMIN D 25 HYDROXY (VIT D DEFICIENCY, FRACTURES): Vit D, 25-Hydroxy: 47.1 ng/mL (ref 30.0–100.0)

## 2023-12-01 LAB — MICROALBUMIN / CREATININE URINE RATIO
Creatinine, Urine: 66.3 mg/dL
Microalb/Creat Ratio: <5 mg/g{creat} (ref 0–29)
Microalbumin, Urine: <3 ug/mL

## 2023-12-01 LAB — VITAMIN B12: Vitamin B-12: 840 pg/mL (ref 232–1245)

## 2023-12-01 LAB — HEMOGLOBIN A1C
Est. average glucose Bld gHb Est-mCnc: 128 mg/dL
Hgb A1c MFr Bld: 6.1 % — ABNORMAL HIGH (ref 4.8–5.6)

## 2023-12-01 LAB — INSULIN, RANDOM: INSULIN: 23.7 u[IU]/mL (ref 2.6–24.9)

## 2023-12-13 NOTE — Telephone Encounter (Signed)
 I spoke with the patient and reschedule her Sleep study due to the Tech off that night.  She is reschedule for 01/12/24 at 9 pm.  Mailed new packet and sent mychart.

## 2023-12-15 ENCOUNTER — Encounter: Payer: Self-pay | Admitting: Neurology

## 2023-12-27 ENCOUNTER — Other Ambulatory Visit (HOSPITAL_COMMUNITY): Payer: Self-pay

## 2023-12-27 ENCOUNTER — Ambulatory Visit (INDEPENDENT_AMBULATORY_CARE_PROVIDER_SITE_OTHER): Admitting: Family Medicine

## 2023-12-27 ENCOUNTER — Encounter (INDEPENDENT_AMBULATORY_CARE_PROVIDER_SITE_OTHER): Payer: Self-pay | Admitting: Family Medicine

## 2023-12-27 VITALS — BP 111/73 | HR 70 | Temp 98.0°F | Ht 61.0 in | Wt 214.0 lb

## 2023-12-27 DIAGNOSIS — E66813 Obesity, class 3: Secondary | ICD-10-CM

## 2023-12-27 DIAGNOSIS — E119 Type 2 diabetes mellitus without complications: Secondary | ICD-10-CM | POA: Diagnosis not present

## 2023-12-27 DIAGNOSIS — E1169 Type 2 diabetes mellitus with other specified complication: Secondary | ICD-10-CM

## 2023-12-27 DIAGNOSIS — E785 Hyperlipidemia, unspecified: Secondary | ICD-10-CM

## 2023-12-27 DIAGNOSIS — Z6841 Body Mass Index (BMI) 40.0 and over, adult: Secondary | ICD-10-CM

## 2023-12-27 DIAGNOSIS — Z7985 Long-term (current) use of injectable non-insulin antidiabetic drugs: Secondary | ICD-10-CM

## 2023-12-27 DIAGNOSIS — K219 Gastro-esophageal reflux disease without esophagitis: Secondary | ICD-10-CM

## 2023-12-27 MED ORDER — TIRZEPATIDE 5 MG/0.5ML ~~LOC~~ SOAJ
5.0000 mg | SUBCUTANEOUS | 0 refills | Status: DC
  Filled 2023-12-27: qty 2, 28d supply, fill #0
  Filled 2024-01-23: qty 2, 28d supply, fill #1

## 2023-12-27 NOTE — Progress Notes (Signed)
 Office: [phone]  /  Fax: [phone]  WEIGHT SUMMARY AND BIOMETRICS  Anthropometric Measurements Height: 5' 1 (1.549 m) Weight: 214 lb (97.1 kg) BMI (Calculated): 40.46 Weight at Last Visit: 213 lb Weight Lost Since Last Visit: 0 Weight Gained Since Last Visit: 1 lb Starting Weight: 230 lb Total Weight Loss (lbs): 16 lb (7.258 kg) Peak Weight: 230 lb Waist Measurement : 50 inches   Body Composition  Body Fat %: 46.9 % Fat Mass (lbs): 100.6 lbs Muscle Mass (lbs): 108 lbs Total Body Water (lbs): 84.8 lbs Visceral Fat Rating : 15   Other Clinical Data RMR: 1915 Fasting: no Labs: no Today's Visit #: 8 Starting Date: 06/29/23    Chief Complaint: OBESITY    History of Present Illness Betty Snyder is a 58 year old female with obesity and type 2 diabetes who presents for obesity treatment plan assessment and progress evaluation.  She is adhering to the category two eating plan approximately ninety percent of the time and is working on meeting her protein goals. However, she struggles with adequate water and fruit and vegetable intake. She is trying not to skip meals and aims for seven to nine hours of sleep per night. She is not currently exercising and has gained one pound over the last month since her last visit. Her hunger levels have been increasing, particularly between lunch and dinner.  She is being treated for type 2 diabetes, with her most recent hemoglobin A1c at 6.1. She is on Mounjaro  2.5 mg and has completed a full box and part of a second box. She accidentally injected a dose into her finger but did not experience any adverse reactions. Her hunger control is not as effective as before, and she experiences increased hunger signals.  She has a history of hyperlipidemia associated with type 2 diabetes. She is working on lifestyle modifications and is on Lipitor 40 mg, which has improved her condition.  She reports occasional heartburn, which flared up  slightly after eating homemade chili with beans while on vacation. She takes famotidine  as needed and has noticed a significant improvement since starting her diet, reducing her previous need for nightly medication.      PHYSICAL EXAM:  Blood pressure 111/73, pulse 70, temperature 98 F (36.7 C), height 5' 1 (1.549 m), weight 214 lb (97.1 kg), last menstrual period 10/22/2013, SpO2 95%. Body mass index is 40.43 kg/m.  DIAGNOSTIC DATA REVIEWED:  BMET    Component Value Date/Time   NA 141 11/29/2023 0923   K 5.0 11/29/2023 0923   CL 102 11/29/2023 0923   CO2 24 11/29/2023 0923   GLUCOSE 81 11/29/2023 0923   GLUCOSE 131 (H) 05/22/2023 0909   BUN 16 11/29/2023 0923   CREATININE 0.80 11/29/2023 0923   CALCIUM  9.5 11/29/2023 0923   GFRNONAA 92 02/18/2019 1112   GFRAA 106 02/18/2019 1112   Lab Results  Component Value Date   HGBA1C 6.1 (H) 11/29/2023   HGBA1C 5.9 02/18/2016   Lab Results  Component Value Date   INSULIN  23.7 11/29/2023   INSULIN  8.3 05/08/2018   Lab Results  Component Value Date   TSH 2.590 06/29/2023   CBC    Component Value Date/Time   WBC 9.1 06/29/2023 0844   WBC 7.6 12/22/2021 0747   RBC 5.21 06/29/2023 0844   RBC 4.80 12/22/2021 0747   HGB 15.3 06/29/2023 0844   HCT 46.7 (H) 06/29/2023 0844   PLT 269 06/29/2023 0844   MCV 90 06/29/2023 0844  MCH 29.4 06/29/2023 0844   MCH 29.1 11/30/2018 0835   MCHC 32.8 06/29/2023 0844   MCHC 33.3 12/22/2021 0747   RDW 13.3 06/29/2023 0844   Iron Studies No results found for: IRON, TIBC, FERRITIN, IRONPCTSAT Lipid Panel     Component Value Date/Time   CHOL 108 11/29/2023 0923   TRIG 111 11/29/2023 0923   HDL 34 (L) 11/29/2023 0923   CHOLHDL 5 05/22/2023 0909   VLDL 77.6 (H) 05/22/2023 0909   LDLCALC 53 11/29/2023 0923   LDLCALC 82 03/03/2021 1630   LDLDIRECT 118.0 05/22/2023 0909   Hepatic Function Panel     Component Value Date/Time   PROT 7.1 11/29/2023 0923   ALBUMIN 4.3  11/29/2023 0923   AST 24 11/29/2023 0923   ALT 32 11/29/2023 0923   ALKPHOS 92 11/29/2023 0923   BILITOT 0.5 11/29/2023 0923      Component Value Date/Time   TSH 2.590 06/29/2023 0844   Nutritional Lab Results  Component Value Date   VD25OH 47.1 11/29/2023   VD25OH 37.7 06/29/2023   VD25OH 25.64 (L) 05/22/2023     Assessment and Plan Assessment & Plan Class 3 obesity Class 3 obesity with recent weight maintenance, likely due to water retention. Hunger levels have increased, particularly between lunch and dinner. Current treatment includes a category two eating plan, which she follows 90% of the time, and Mounjaro  2.5 mg. She is not currently exercising and struggles with water and fruit/vegetable intake. Sleep is being optimized to 7-9 hours per night. - Increase Mounjaro  dose after completing current box to improve hunger control. - Educate on potential side effects of increased Mounjaro  dose, including nausea, especially if eating too much or too many carbs at once. - Advise on maintaining regular meal times to prevent queasiness and excessive hunger signals. - Encourage starting a simple exercise routine, such as a 15-minute walk or chair yoga, to increase calorie burn. - Discuss the importance of not skipping meals to maintain metabolism.  Type 2 diabetes mellitus Type 2 diabetes mellitus with well-controlled glucose levels. Recent hemoglobin A1c is 6.1%, the best in recent years. Current management includes Mounjaro  2.5 mg and lifestyle modifications. - Continue diet, exercise and weight loss as discussed today as an important part of the treatment plan   Hyperlipidemia Hyperlipidemia associated with type 2 diabetes, currently well-controlled with lifestyle modifications and Lipitor 40 mg. - Continue diet, exercise and weight loss as discussed today as an important part of the treatment plan   Gastroesophageal reflux disease (GERD) GERD is well-controlled with lifestyle  changes and as-needed famotidine . Occasional flare-ups occur with specific foods, such as chili. Discussed potential for heartburn flare-ups after increasing Mounjaro  dose, usually the day after administration, due to delayed gastric emptying. - Continue famotidine  as needed, especially if heartburn flares after increasing Mounjaro  dose.    Estha was informed of the importance of frequent follow up visits to maximize her success with intensive lifestyle modifications for her obesity and obesity related health conditions as recommended by USPSTF and CMS guidelines   Louann Penton, MD

## 2023-12-28 ENCOUNTER — Other Ambulatory Visit (HOSPITAL_COMMUNITY): Payer: Self-pay

## 2023-12-29 ENCOUNTER — Other Ambulatory Visit (HOSPITAL_COMMUNITY): Payer: Self-pay

## 2023-12-29 ENCOUNTER — Encounter

## 2024-01-12 ENCOUNTER — Ambulatory Visit: Admitting: Neurology

## 2024-01-12 DIAGNOSIS — G2581 Restless legs syndrome: Secondary | ICD-10-CM

## 2024-01-12 DIAGNOSIS — R351 Nocturia: Secondary | ICD-10-CM

## 2024-01-12 DIAGNOSIS — R519 Headache, unspecified: Secondary | ICD-10-CM

## 2024-01-12 DIAGNOSIS — R0902 Hypoxemia: Secondary | ICD-10-CM

## 2024-01-12 DIAGNOSIS — R0683 Snoring: Secondary | ICD-10-CM

## 2024-01-12 DIAGNOSIS — R002 Palpitations: Secondary | ICD-10-CM

## 2024-01-12 DIAGNOSIS — G478 Other sleep disorders: Secondary | ICD-10-CM

## 2024-01-12 DIAGNOSIS — F519 Sleep disorder not due to a substance or known physiological condition, unspecified: Secondary | ICD-10-CM

## 2024-01-12 DIAGNOSIS — E662 Morbid (severe) obesity with alveolar hypoventilation: Secondary | ICD-10-CM

## 2024-01-19 ENCOUNTER — Other Ambulatory Visit: Payer: Self-pay | Admitting: Neurology

## 2024-01-19 ENCOUNTER — Ambulatory Visit: Payer: Self-pay | Admitting: Neurology

## 2024-01-19 DIAGNOSIS — G4733 Obstructive sleep apnea (adult) (pediatric): Secondary | ICD-10-CM

## 2024-01-19 DIAGNOSIS — R519 Headache, unspecified: Secondary | ICD-10-CM

## 2024-01-19 DIAGNOSIS — G2581 Restless legs syndrome: Secondary | ICD-10-CM

## 2024-01-19 DIAGNOSIS — R0902 Hypoxemia: Secondary | ICD-10-CM

## 2024-01-19 DIAGNOSIS — E662 Morbid (severe) obesity with alveolar hypoventilation: Secondary | ICD-10-CM

## 2024-01-19 NOTE — Procedures (Signed)
 [facility] PAP TITRATION INTERPRETATION REPORT   STUDY DATE: 01/12/2024      PATIENT NAME:  Betty Snyder         DATE OF BIRTH:  [birth year]  PATIENT ID:  [id number]    TYPE OF STUDY:  CPAP  REFERRAL PHYSICIAN: LOUANN PENTON, MD SCORING TECHNICIAN: Delon Sprung, RPSGT   HISTORY: This 58 year-old Female is here for a CPAP titration, reporting non restorative sleep in the setting of an elevated BMI. wakes up from snoring , choking and has nocturia multiple times. She continues to sleep for a total of 6 -7 hours?1) Excessive daytime sleepiness and RLS , in a patient with high risk factors for OSA by elevated BMI ( 35 ) , neck size (18) and airway anatomy. 2) severely fragmented sleep, due to choking, snoring and nocturia. 3) Delayed sleep onset due to RLS. 4) Sleep talking , vivid dreams, active dreams. 5) Former smoker. The patient underwent a SANSA HST on  09-07-2023, with  an AASM based AHI ( apnea-hypopnea index) of 49.3/h ????????????????????Snoring over 80% of the recorded sleep time. Oxygen Saturation (%) Mean: 89% ( low) ,O2 Saturation Range (%):Between a nadir at 50% (!) and the highest 02 saturation at 97% and O2 Saturation (minutes) <89% 142 minutes - severe hypoxia , severe OSA.  ? Epworth sleepiness score: 12/ 24 points , FSS endorsed at 37/ 63 points. BMI: 35 kg/m. Neck Circumference:?18  DESCRIPTION: A sleep technologist was in attendance for the duration of the recording.  Data collection, scoring, video monitoring, and reporting were performed in compliance with the AASM Manual for the Scoring of Sleep and Associated Events; (Hypopnea is scored based on the criteria listed in Section VIII D. 1b in the AASM Manual V2.6 using a 4% oxygen desaturation rule or Hypopnea is scored based on the criteria listed in Section VIII D. 1a in the AASM Manual V2.6 using 3% oxygen desaturation and /or arousal rule).  A physician certified by the American Board  of Sleep Medicine reviewed each epoch of the study.  ADDITIONAL INFORMATION:  Height: 61.0 in Weight: 214 lb (BMI 40) Neck Size: 18.0 in    MEDICATIONS: Xanax , Lipitor, Vitamin D3, Lexapro , Pepcid , Zanaflex .   SLEEP CONTINUITY AND SLEEP ARCHITECTURE:  CPAP was initiated at 5 cm water, 3 cm EPR and titrated to CPAP at 12 cm water.  Lights off was at 21:56: and lights on 04:43: (6.8 hours in bed). Total sleep time was 316.5 minutes (0.0% supine;  100.0% lateral;  0.0% prone, 11.4% REM sleep), with a decreased sleep efficiency at 77.8%.  Sleep latency was 1.0 minutes.  Of the total sleep time, the percentage of stage N1 sleep was 16.0%, stage N2 sleep was 70.5%, stage N3 sleep was 2.2%, and REM sleep was 11.4%. There were 3 Stage R periods observed on this study night, 40 awakenings (i.e. transitions to Stage W from any sleep stage), and 131.0 total stage transitions. There were 3 bathroom breaks.  Wake after sleep onset (WASO) time accounted for 89 minutes.  AROUSAL: There were 33.0 arousals/hour.  Of these, 62 were identified as respiratory-related arousals (11.8 /h), 0 were PLM-related arousals (0.0 /h), and 112 were non-specific arousals (21.2 /h)  RESPIRATORY MONITORING:   Based on AASM criteria (using a 3% oxygen desaturation and /or arousal rule for scoring hypopneas), there was only 1 apnea (1 obstructive; 0 central; 0 mixed), and 108 hypopneas.  The Apnea index was 0.2/h. The Hypopnea index was 20.5/h.  The AHI ( apnea-hypopnea index) was 20.7/h overall , ( there was no sleep in supine sleep, therefore recording was in non supine position only) : 20.7/h in non-supine sleep; 11.7/h in  REM, 22/h in NREM sleep).   OXIMETRY: Total sleep time spent below 89% was 25.6 minutes, or 8.1% of total sleep time. Respiratory events were associated with oxyhemoglobin desaturation to a nadir during sleep of 82% from a mean of 92%. CARDIAC: The electrocardiogram documented normal sinus rhythm.  The average  heart rate during sleep was 66 bpm.  The maximum heart rate during sleep was 92 bpm and the minimum heart rate was 56 bpm.    BODY POSITION: Duration of total sleep and percent of total sleep in their respective position is as follows: supine 00 minutes (0.0%), non-supine 316.5 minutes (100.0%); right 302 minutes (95.6%), left 14 minutes (4.4%), and prone 00 minutes (0.0%). Total supine REM sleep time was 00 minutes (0.0% of total REM sleep). LIMB MOVEMENTS: There were 0 periodic limb movements of sleep (0.0/h), of which 0 (0.0/h) were associated with an arousal. the patient experienced RLS , and wrote that this was the symptom interrupting her sleep.   IMPRESSION :  The Diagnosis remains:  moderate- severe obstructive sleep apnea with hypoventilation and hypoxia.  CPAP was titrated from 5 through 12 cm pressure , with EPR 3 cm H20 on a dream-ware Respironics nasal cradle mask.   There was no complete resolution of sleep hypopnea and hypoxia but a reduction to an AHI of 6.6/h.  Sleep hypoxia during NREM was already corrected at 9 cm water pressure, but sleep hypoxia and hypopneas returned once REM sleep was observed ( under 12 cm water pressure ), this last explored pressure of 12 cm water failed to control REM sleep hypopnea and hypoxia ( 21 minutes on and off under 90% oxygen saturation). There was no hypoxia over 5 minute consecutive duration seen.  Snoring was mild .  This PSG did fail to capture RLS movements by EMG electrode during wakefulness or PLM activity during sleep, but the video recording showed clearly ongoing movement in the upper body, and this correlated with changes on EEG .   RECOMMENDATIONS: There was moderate severe OSA with hypoventilation seen and REM sleep exacerbated both; the hypoventilation ( hypopneas increased)  and hypoxia. Best result was seen under 12 cm water pressure.   I recommend a CPAP pressure at 13 cm water, with 2 cm EPR setting to further improve this  condition. I will prescribe an auto titration device setting between 8 and 15 cm water, 2 cm EPR , and mask of patient's choice.    Further weight loss can still be helpful as well as building better core strength and improve any pulmonary condition that may be present.    The restless movements captured on Video did not correlate to Leg EMG activity but involved the torso and arms. These non-stop movements could be a result of a back or spinal cord injury, nerve impingement or neuropathy. I will address these in my next visit.  DEDRA GORES, MD

## 2024-01-22 NOTE — Telephone Encounter (Signed)
 Spoke to patient gave cpap titration results Pt with [facility] . Pt aware of insurance compliance Pt has f/u appointment with Jessica,NP 04/2024  Pt already has pap machine at home and has been using machine sent orders to [facility] this am

## 2024-01-22 NOTE — Telephone Encounter (Signed)
-----   Message from Toughkenamon Dohmeier sent at 01/19/2024 11:39 AM EDT ----- Please tell the patient that her apnea improved on CPAP but was not fully treated at the highest pressure applied that night , 12 cm CPAP. I will set her CPAP ( auto device ) to a range that allows to go higher in pressure than 12 cm and she can use the mask that she used in the lab ( if she likes it ) . She did not need added oxygen  and she did not snore. I could see how restless she was on video but not on the leg attached EMG -when we meet for follow up, we will address her restless limbs ( its the torso and arms too) .  ----- Message ----- From: Rebecka Fleeta Higashi In One Three One Sent: 01/19/2024  11:26 AM EDT To: Dedra Gores, MD

## 2024-01-24 ENCOUNTER — Ambulatory Visit (INDEPENDENT_AMBULATORY_CARE_PROVIDER_SITE_OTHER): Admitting: Family Medicine

## 2024-01-24 ENCOUNTER — Encounter (INDEPENDENT_AMBULATORY_CARE_PROVIDER_SITE_OTHER): Payer: Self-pay | Admitting: Family Medicine

## 2024-01-24 VITALS — BP 109/72 | HR 66 | Temp 98.4°F | Ht 61.0 in | Wt 213.0 lb

## 2024-01-24 DIAGNOSIS — R002 Palpitations: Secondary | ICD-10-CM | POA: Diagnosis not present

## 2024-01-24 DIAGNOSIS — F418 Other specified anxiety disorders: Secondary | ICD-10-CM | POA: Diagnosis not present

## 2024-01-24 DIAGNOSIS — E119 Type 2 diabetes mellitus without complications: Secondary | ICD-10-CM | POA: Diagnosis not present

## 2024-01-24 DIAGNOSIS — E1169 Type 2 diabetes mellitus with other specified complication: Secondary | ICD-10-CM

## 2024-01-24 DIAGNOSIS — Z6841 Body Mass Index (BMI) 40.0 and over, adult: Secondary | ICD-10-CM

## 2024-01-24 DIAGNOSIS — E785 Hyperlipidemia, unspecified: Secondary | ICD-10-CM | POA: Diagnosis not present

## 2024-01-24 DIAGNOSIS — Z7985 Long-term (current) use of injectable non-insulin antidiabetic drugs: Secondary | ICD-10-CM

## 2024-01-24 DIAGNOSIS — R0789 Other chest pain: Secondary | ICD-10-CM | POA: Diagnosis not present

## 2024-01-24 DIAGNOSIS — E669 Obesity, unspecified: Secondary | ICD-10-CM

## 2024-01-24 MED ORDER — ESCITALOPRAM OXALATE 10 MG PO TABS: 10.0000 mg | ORAL_TABLET | Freq: Every day | ORAL | 0 refills | Status: DC

## 2024-01-24 NOTE — Progress Notes (Signed)
 Office: [phone]  /  Fax: [phone]  WEIGHT SUMMARY AND BIOMETRICS  Anthropometric Measurements Height: 5' 1 (1.549 m) Weight: 213 lb (96.6 kg) BMI (Calculated): 40.27 Weight at Last Visit: 214 lb Weight Lost Since Last Visit: 1 lb Weight Gained Since Last Visit: 0 Starting Weight: 230 lb Total Weight Loss (lbs): 17 lb (7.711 kg) Peak Weight: 230 lb Waist Measurement : 50 inches   Body Composition  Body Fat %: 46.8 % Fat Mass (lbs): 99.8 lbs Muscle Mass (lbs): 107.6 lbs Total Body Water (lbs): 84.4 lbs Visceral Fat Rating : 15   Other Clinical Data RMR: 1915 Fasting: no Labs: no Today's Visit #: 9 Starting Date: 06/29/23    Chief Complaint: OBESITY   History of Present Illness Betty Snyder is a 58 year old female with obesity, type 2 diabetes, and hyperlipidemia who presents for obesity treatment and progress assessment.  She is adhering to the category two eating plan approximately ninety percent of the time, focusing on increasing her intake of whole fruits and vegetables and aiming to meet the recommended protein intake. She generally achieves seven or more hours of quality sleep per night but occasionally skips meals and does not always maintain adequate hydration. She is not currently engaging in exercise but has lost one pound in the last month since her last visit.  For her type 2 diabetes, she is on Mounjaro  5 mg weekly and is working on improving her diet, exercise, and weight loss. She has completed two doses of the 5 mg Mounjaro  and is monitoring her hunger levels to evaluate the effectiveness of the dose.  She is managing hyperlipidemia with Lipitor 40 mg and has been following a low cholesterol diet while working on weight loss to improve her cholesterol levels.  She experiences sharp, stabby pains in the chest area that last about ten seconds and occur at rest. These episodes are described as 'freaky' and sometimes her pulse is visible  through her shirt. She also experiences palpitations and difficulty sleeping due to racing thoughts, which she attributes to anxiety. She is currently on Lexapro  20 mg for anxiety and depression.  She has a family history of cardiac issues, including a sister who had a stroke and another who underwent a triple bypass surgery. She is concerned about her heart health due to this family history.      PHYSICAL EXAM:  Blood pressure 109/72, pulse 66, temperature 98.4 F (36.9 C), height 5' 1 (1.549 m), weight 213 lb (96.6 kg), last menstrual period 10/22/2013, SpO2 96%. Body mass index is 40.25 kg/m.  DIAGNOSTIC DATA REVIEWED:  BMET    Component Value Date/Time   NA 141 11/29/2023 0923   K 5.0 11/29/2023 0923   CL 102 11/29/2023 0923   CO2 24 11/29/2023 0923   GLUCOSE 81 11/29/2023 0923   GLUCOSE 131 (H) 05/22/2023 0909   BUN 16 11/29/2023 0923   CREATININE 0.80 11/29/2023 0923   CALCIUM  9.5 11/29/2023 0923   GFRNONAA 92 02/18/2019 1112   GFRAA 106 02/18/2019 1112   Lab Results  Component Value Date   HGBA1C 6.1 (H) 11/29/2023   HGBA1C 5.9 02/18/2016   Lab Results  Component Value Date   INSULIN  23.7 11/29/2023   INSULIN  8.3 05/08/2018   Lab Results  Component Value Date   TSH 2.590 06/29/2023   CBC    Component Value Date/Time   WBC 9.1 06/29/2023 0844   WBC 7.6 12/22/2021 0747   RBC 5.21 06/29/2023 0844  RBC 4.80 12/22/2021 0747   HGB 15.3 06/29/2023 0844   HCT 46.7 (H) 06/29/2023 0844   PLT 269 06/29/2023 0844   MCV 90 06/29/2023 0844   MCH 29.4 06/29/2023 0844   MCH 29.1 11/30/2018 0835   MCHC 32.8 06/29/2023 0844   MCHC 33.3 12/22/2021 0747   RDW 13.3 06/29/2023 0844   Iron Studies No results found for: IRON, TIBC, FERRITIN, IRONPCTSAT Lipid Panel     Component Value Date/Time   CHOL 108 11/29/2023 0923   TRIG 111 11/29/2023 0923   HDL 34 (L) 11/29/2023 0923   CHOLHDL 5 05/22/2023 0909   VLDL 77.6 (H) 05/22/2023 0909   LDLCALC 53  11/29/2023 0923   LDLCALC 82 03/03/2021 1630   LDLDIRECT 118.0 05/22/2023 0909   Hepatic Function Panel     Component Value Date/Time   PROT 7.1 11/29/2023 0923   ALBUMIN 4.3 11/29/2023 0923   AST 24 11/29/2023 0923   ALT 32 11/29/2023 0923   ALKPHOS 92 11/29/2023 0923   BILITOT 0.5 11/29/2023 0923      Component Value Date/Time   TSH 2.590 06/29/2023 0844   Nutritional Lab Results  Component Value Date   VD25OH 47.1 11/29/2023   VD25OH 37.7 06/29/2023   VD25OH 25.64 (L) 05/22/2023     Assessment and Plan Assessment & Plan Obesity Obesity management with a focus on dietary adherence and physical activity. She has lost one pound in the last month, which is considered a positive outcome given recent life events. Emphasis on slow and steady weight loss to ensure sustainability and prevent rapid regain. - Continue category two eating plan with focus on whole fruits and vegetables - Encourage consistent meal intake and adequate hydration - Participate in step challenge at work to increase daily physical activity - Aim for a daily step count of 4000 steps as a starting goal  Type 2 diabetes mellitus Type 2 diabetes management with Mounjaro  5 mg weekly. She is working on improving diet and exercise to aid in weight loss and glycemic control. Discussion on the importance of appropriate hunger levels and avoiding excessive dose escalation to prevent adverse effects such as muscle loss and hair loss. - Continue Mounjaro  5 mg weekly - Evaluate hunger levels and adjust Mounjaro  dose if necessary after four injections  Hyperlipidemia Hyperlipidemia management with Lipitor 40 mg and dietary modifications. Recent labs show LDL at 53, which is below the target of 70, indicating effective management. Continued emphasis on exercise to maintain cardiovascular health. - Continue Lipitor 40 mg daily - Maintain low cholesterol diet - Encourage regular physical activity to support  cardiovascular health  Depression and anxiety Depression and anxiety management with Lexapro . She reports increased anxiety symptoms, suggesting possible tolerance to current Lexapro  dose. Plan to increase Lexapro  to 30 mg daily to address symptoms. Discussion on potential side effects of increased dose, including dry mouth. - Increase Lexapro  to 30 mg daily by taking one 20 mg and one 10 mg tablet - Monitor for side effects such as dry mouth and adjust if necessary  Palpitations and intermittent chest discomfort Intermittent palpitations and chest discomfort, primarily occurring at rest. Each last a few seconds and can be days apart. EKG today shows sinus arrhythmia, which is considered normal and not indicative of a cardiac issue. Differential includes anxiety, PVCs, or non-cardiac causes such as arthritis. - Perform EKG today, no ST elevation or t wave inversions. - Encouraged referral to primary care for further evaluation if symptoms persist - Document episodes  of palpitations and chest discomfort, including timing, activity, and associated symptoms - She will consider her options and let me know if she would like a cardiology referral, which I will send in upon her request.     I personally spent a total of 54 minutes in the care of the patient today including preparing to see the patient, performing a medically appropriate evaluation of current problems, placing orders in the EMR, documenting clinical information in the EMR, customized nutritional counseling for their specific health and social needs, referring as appropriate to other health care professionals, independently interpreting results, and discussing results with the patient and educating them on how these results can affect their health and weight.    Beata was informed of the importance of frequent follow up visits to maximize her success with intensive lifestyle modifications for her obesity and obesity related health  conditions as recommended by USPSTF and CMS guidelines   Louann Penton, MD

## 2024-02-01 ENCOUNTER — Telehealth (INDEPENDENT_AMBULATORY_CARE_PROVIDER_SITE_OTHER): Payer: Self-pay

## 2024-02-01 NOTE — Telephone Encounter (Signed)
 Fax successful tx notice received.

## 2024-02-01 NOTE — Telephone Encounter (Signed)
 Call to patient to ask if she has received her last prescription for Lexapro .  She stated that she has not and needed a faxed clarification sheet from [facility] from us .    Sheet has been signed and faxed back.  Awaiting transmission response.

## 2024-02-26 ENCOUNTER — Other Ambulatory Visit (HOSPITAL_COMMUNITY): Payer: Self-pay

## 2024-02-26 ENCOUNTER — Encounter (INDEPENDENT_AMBULATORY_CARE_PROVIDER_SITE_OTHER): Payer: Self-pay | Admitting: Family Medicine

## 2024-02-26 ENCOUNTER — Ambulatory Visit (INDEPENDENT_AMBULATORY_CARE_PROVIDER_SITE_OTHER): Payer: Self-pay | Admitting: Family Medicine

## 2024-02-26 VITALS — BP 114/72 | HR 67 | Temp 98.0°F | Ht 61.0 in | Wt 209.0 lb

## 2024-02-26 DIAGNOSIS — E119 Type 2 diabetes mellitus without complications: Secondary | ICD-10-CM

## 2024-02-26 DIAGNOSIS — Z6839 Body mass index (BMI) 39.0-39.9, adult: Secondary | ICD-10-CM

## 2024-02-26 DIAGNOSIS — K760 Fatty (change of) liver, not elsewhere classified: Secondary | ICD-10-CM

## 2024-02-26 DIAGNOSIS — Z7985 Long-term (current) use of injectable non-insulin antidiabetic drugs: Secondary | ICD-10-CM

## 2024-02-26 DIAGNOSIS — E1169 Type 2 diabetes mellitus with other specified complication: Secondary | ICD-10-CM

## 2024-02-26 DIAGNOSIS — F419 Anxiety disorder, unspecified: Secondary | ICD-10-CM

## 2024-02-26 DIAGNOSIS — E669 Obesity, unspecified: Secondary | ICD-10-CM

## 2024-02-26 DIAGNOSIS — F418 Other specified anxiety disorders: Secondary | ICD-10-CM

## 2024-02-26 MED ORDER — ESCITALOPRAM OXALATE 10 MG PO TABS
10.0000 mg | ORAL_TABLET | Freq: Every day | ORAL | 0 refills | Status: DC
  Filled 2024-02-26: qty 90, 90d supply, fill #0

## 2024-02-26 MED ORDER — TIRZEPATIDE 5 MG/0.5ML ~~LOC~~ SOAJ
5.0000 mg | SUBCUTANEOUS | 0 refills | Status: DC
  Filled 2024-02-26 – 2024-03-08 (×2): qty 2, 28d supply, fill #0

## 2024-02-26 NOTE — Progress Notes (Signed)
 Office: [phone]  /  Fax: [phone]  WEIGHT SUMMARY AND BIOMETRICS  Anthropometric Measurements Height: 5' 1 (1.549 m) Weight: 209 lb (94.8 kg) BMI (Calculated): 39.51 Weight at Last Visit: 213 lb Weight Lost Since Last Visit: 4 lb Weight Gained Since Last Visit: 0 Starting Weight: 230 lb Total Weight Loss (lbs): 21 lb (9.526 kg) Peak Weight: 230 lb Waist Measurement : 50 inches   Body Composition  Body Fat %: 46.6 % Fat Mass (lbs): 97.6 lbs Muscle Mass (lbs): 106.4 lbs Total Body Water (lbs): 83.2 lbs Visceral Fat Rating : 15   Other Clinical Data RMR: 1915 Fasting: no Labs: no Today's Visit #: 10 Starting Date: 06/29/23    Chief Complaint: OBESITY    History of Present Illness Betty Snyder is a 58 year old female with obesity and type 2 diabetes who presents for obesity treatment and progress assessment.  She is adhering to a category two eating plan approximately 70% of the time, focusing on increasing her intake of fruits, vegetables, and protein. She is not skipping meals and aims to get 7 to 9 hours of sleep per night. She struggles with hydration at times. She has lost 4 pounds in the last month since her last visit.  She has type 2 diabetes and is being treated with Mounjaro , 5 mg once a week, in addition to diet, exercise, and weight loss efforts. The patient reports that she has not experienced any side effects from the medication.  She has a history of anxiety and emotional eating behaviors. She is currently taking Lexapro , 30 mg per day, composed of a 10 mg and a 20 mg pill. She requests a refill of these medications.  Her father recently passed away suddenly at 48 years old, which has been a source of emotional distress. He had been active but experienced chest pains prior to his death, which were not communicated to the family. Her mother is currently experiencing anger and grief over his passing.  Her older sister has been diagnosed with  early onset Parkinson's disease and is on medication for it. She is concerned about the potential for Parkinson's in her own future.      PHYSICAL EXAM:  Blood pressure 114/72, pulse 67, temperature 98 F (36.7 C), height 5' 1 (1.549 m), weight 209 lb (94.8 kg), last menstrual period 10/22/2013, SpO2 96%. Body mass index is 39.49 kg/m.  DIAGNOSTIC DATA REVIEWED:  BMET    Component Value Date/Time   NA 141 11/29/2023 0923   K 5.0 11/29/2023 0923   CL 102 11/29/2023 0923   CO2 24 11/29/2023 0923   GLUCOSE 81 11/29/2023 0923   GLUCOSE 131 (H) 05/22/2023 0909   BUN 16 11/29/2023 0923   CREATININE 0.80 11/29/2023 0923   CALCIUM  9.5 11/29/2023 0923   GFRNONAA 92 02/18/2019 1112   GFRAA 106 02/18/2019 1112   Lab Results  Component Value Date   HGBA1C 6.1 (H) 11/29/2023   HGBA1C 5.9 02/18/2016   Lab Results  Component Value Date   INSULIN  23.7 11/29/2023   INSULIN  8.3 05/08/2018   Lab Results  Component Value Date   TSH 2.590 06/29/2023   CBC    Component Value Date/Time   WBC 9.1 06/29/2023 0844   WBC 7.6 12/22/2021 0747   RBC 5.21 06/29/2023 0844   RBC 4.80 12/22/2021 0747   HGB 15.3 06/29/2023 0844   HCT 46.7 (H) 06/29/2023 0844   PLT 269 06/29/2023 0844   MCV 90 06/29/2023 0844  MCH 29.4 06/29/2023 0844   MCH 29.1 11/30/2018 0835   MCHC 32.8 06/29/2023 0844   MCHC 33.3 12/22/2021 0747   RDW 13.3 06/29/2023 0844   Iron Studies No results found for: IRON, TIBC, FERRITIN, IRONPCTSAT Lipid Panel     Component Value Date/Time   CHOL 108 11/29/2023 0923   TRIG 111 11/29/2023 0923   HDL 34 (L) 11/29/2023 0923   CHOLHDL 5 05/22/2023 0909   VLDL 77.6 (H) 05/22/2023 0909   LDLCALC 53 11/29/2023 0923   LDLCALC 82 03/03/2021 1630   LDLDIRECT 118.0 05/22/2023 0909   Hepatic Function Panel     Component Value Date/Time   PROT 7.1 11/29/2023 0923   ALBUMIN 4.3 11/29/2023 0923   AST 24 11/29/2023 0923   ALT 32 11/29/2023 0923   ALKPHOS 92  11/29/2023 0923   BILITOT 0.5 11/29/2023 0923      Component Value Date/Time   TSH 2.590 06/29/2023 0844   Nutritional Lab Results  Component Value Date   VD25OH 47.1 11/29/2023   VD25OH 37.7 06/29/2023   VD25OH 25.64 (L) 05/22/2023     Assessment and Plan Assessment & Plan Obesity Management is ongoing with a focus on dietary changes and weight loss. She adheres to the category two eating plan 70% of the time, increases fruit and vegetable intake, and aims for adequate protein consumption. She has lost four pounds in the last month. Hydration is a challenge, and exercise is encouraged. Current treatment includes Mounjaro  5 mg weekly, with no side effects reported. Increasing the dose could increase the risk of side effects such as nausea, vomiting, and constipation. - Continue Mounjaro  5 mg weekly. - Encouraged adherence to the category two eating plan. - Encouraged increased hydration. - Encouraged regular exercise.  Type 2 diabetes mellitus Managed with Mounjaro  and lifestyle modifications. Blood sugar control is monitored, and weight loss is contributing positively to management. - Continue Mounjaro  5 mg weekly. - Continue monitoring blood sugar levels.  Fatty liver disease Present. Liver enzymes have been elevated occasionally but are currently normal. Weight loss is the primary treatment strategy, which is expected to reduce liver fat and improve liver function. - Continue weight loss efforts to reduce liver fat.  Anxiety disorder Managed with Lexapro  30 mg daily. She requests a refill of her medication. - Refilled Lexapro  30 mg daily.  [name] was counseled on the importance of maintaining healthy lifestyle habits, including balanced nutrition, regular physical activity, and behavioral modifications, while taking antiobesity medication.  Patient verbalized understanding that medication is an adjunct to, not a replacement for, lifestyle changes and that the long-term  success and weight maintenance depend on continued adherence to these strategies.   Betty Snyder was informed of the importance of frequent follow up visits to maximize her success with intensive lifestyle modifications for her obesity and obesity related health conditions as recommended by USPSTF and CMS guidelines   Louann Penton, MD

## 2024-03-06 ENCOUNTER — Other Ambulatory Visit (HOSPITAL_COMMUNITY): Payer: Self-pay

## 2024-03-08 ENCOUNTER — Other Ambulatory Visit (HOSPITAL_COMMUNITY): Payer: Self-pay

## 2024-03-19 ENCOUNTER — Encounter (INDEPENDENT_AMBULATORY_CARE_PROVIDER_SITE_OTHER): Payer: Self-pay | Admitting: Family Medicine

## 2024-03-19 ENCOUNTER — Ambulatory Visit (INDEPENDENT_AMBULATORY_CARE_PROVIDER_SITE_OTHER): Payer: Self-pay | Admitting: Family Medicine

## 2024-03-19 ENCOUNTER — Other Ambulatory Visit (HOSPITAL_COMMUNITY): Payer: Self-pay

## 2024-03-19 VITALS — BP 111/76 | HR 78 | Temp 98.1°F | Ht 61.0 in | Wt 209.0 lb

## 2024-03-19 DIAGNOSIS — F418 Other specified anxiety disorders: Secondary | ICD-10-CM

## 2024-03-19 DIAGNOSIS — Z6839 Body mass index (BMI) 39.0-39.9, adult: Secondary | ICD-10-CM

## 2024-03-19 DIAGNOSIS — F3289 Other specified depressive episodes: Secondary | ICD-10-CM

## 2024-03-19 DIAGNOSIS — E1165 Type 2 diabetes mellitus with hyperglycemia: Secondary | ICD-10-CM

## 2024-03-19 MED ORDER — ESCITALOPRAM OXALATE 20 MG PO TABS
20.0000 mg | ORAL_TABLET | Freq: Every day | ORAL | 0 refills | Status: AC
  Filled 2024-03-19: qty 30, 30d supply, fill #0
  Filled 2024-04-23: qty 30, 30d supply, fill #1

## 2024-03-19 MED ORDER — TIRZEPATIDE 7.5 MG/0.5ML ~~LOC~~ SOAJ
7.5000 mg | SUBCUTANEOUS | 0 refills | Status: DC
  Filled 2024-03-19 – 2024-03-29 (×2): qty 2, 28d supply, fill #0

## 2024-03-19 MED ORDER — ESCITALOPRAM OXALATE 10 MG PO TABS
10.0000 mg | ORAL_TABLET | Freq: Every day | ORAL | 0 refills | Status: AC
  Filled 2024-03-19: qty 90, 90d supply, fill #0
  Filled 2024-04-23: qty 30, 30d supply, fill #0

## 2024-03-19 NOTE — Progress Notes (Signed)
 Office: [phone]  /  Fax: [phone]  WEIGHT SUMMARY AND BIOMETRICS  Anthropometric Measurements Height: 5' 1 (1.549 m) Weight: 209 lb (94.8 kg) BMI (Calculated): 39.51 Weight at Last Visit: 209 lb Weight Lost Since Last Visit: 0 Weight Gained Since Last Visit: 0 Starting Weight: 230 lb Total Weight Loss (lbs): 21 lb (9.526 kg) Peak Weight: 230 lb Waist Measurement : 50 inches   Body Composition  Body Fat %: 46.3 % Fat Mass (lbs): 96.8 lbs Muscle Mass (lbs): 106.6 lbs Total Body Water (lbs): 82.4 lbs Visceral Fat Rating : 14   Other Clinical Data RMR: 1915 Fasting: no Labs: no Today's Visit #: 11 Starting Date: 06/29/23    Chief Complaint: OBESITY    History of Present Illness Betty Snyder is a 58 year old female with obesity and type 2 diabetes who presents for obesity treatment and progress assessment.  She is adhering to a category two eating plan approximately ninety percent of the time, focusing on increasing vegetable and protein intake while avoiding skipping meals. She has maintained her weight over the last three weeks, including through Thanksgiving, and is not currently engaging in any exercise. She generally sleeps seven to nine hours per night.  She has type 2 diabetes and is currently on Mounjaro  5 mg weekly. Despite this medication, she reports an increase in appetite and has completed six injections of the 5 mg dose. She requests a refill of Mounjaro .  She is managing hyperlipidemia with Lipitor 40 mg, which she reports as stable. She is working on improving her diet and exercise to aid in weight loss.  She is on Lexapro  30 mg daily, a combination of 20 mg and 10 mg doses, and requests a refill.  Her dietary routine includes 20 grams of protein in the morning with toast, a sandwich with four ounces of meat for lunch, and eight ounces of meat for dinner. She supplements her protein intake with yogurt and occasionally has [employer]  ice.      PHYSICAL EXAM:  Blood pressure 111/76, pulse 78, temperature 98.1 F (36.7 C), height 5' 1 (1.549 m), weight 209 lb (94.8 kg), last menstrual period 10/22/2013, SpO2 96%. Body mass index is 39.49 kg/m.  DIAGNOSTIC DATA REVIEWED:  BMET    Component Value Date/Time   NA 141 11/29/2023 0923   K 5.0 11/29/2023 0923   CL 102 11/29/2023 0923   CO2 24 11/29/2023 0923   GLUCOSE 81 11/29/2023 0923   GLUCOSE 131 (H) 05/22/2023 0909   BUN 16 11/29/2023 0923   CREATININE 0.80 11/29/2023 0923   CALCIUM  9.5 11/29/2023 0923   GFRNONAA 92 02/18/2019 1112   GFRAA 106 02/18/2019 1112   Lab Results  Component Value Date   HGBA1C 6.1 (H) 11/29/2023   HGBA1C 5.9 02/18/2016   Lab Results  Component Value Date   INSULIN  23.7 11/29/2023   INSULIN  8.3 05/08/2018   Lab Results  Component Value Date   TSH 2.590 06/29/2023   CBC    Component Value Date/Time   WBC 9.1 06/29/2023 0844   WBC 7.6 12/22/2021 0747   RBC 5.21 06/29/2023 0844   RBC 4.80 12/22/2021 0747   HGB 15.3 06/29/2023 0844   HCT 46.7 (H) 06/29/2023 0844   PLT 269 06/29/2023 0844   MCV 90 06/29/2023 0844   MCH 29.4 06/29/2023 0844   MCH 29.1 11/30/2018 0835   MCHC 32.8 06/29/2023 0844   MCHC 33.3 12/22/2021 0747   RDW 13.3 06/29/2023 0844  Iron Studies No results found for: IRON, TIBC, FERRITIN, IRONPCTSAT Lipid Panel     Component Value Date/Time   CHOL 108 11/29/2023 0923   TRIG 111 11/29/2023 0923   HDL 34 (L) 11/29/2023 0923   CHOLHDL 5 05/22/2023 0909   VLDL 77.6 (H) 05/22/2023 0909   LDLCALC 53 11/29/2023 0923   LDLCALC 82 03/03/2021 1630   LDLDIRECT 118.0 05/22/2023 0909   Hepatic Function Panel     Component Value Date/Time   PROT 7.1 11/29/2023 0923   ALBUMIN 4.3 11/29/2023 0923   AST 24 11/29/2023 0923   ALT 32 11/29/2023 0923   ALKPHOS 92 11/29/2023 0923   BILITOT 0.5 11/29/2023 0923      Component Value Date/Time   TSH 2.590 06/29/2023 0844   Nutritional Lab  Results  Component Value Date   VD25OH 47.1 11/29/2023   VD25OH 37.7 06/29/2023   VD25OH 25.64 (L) 05/22/2023     Assessment and Plan Assessment & Plan Obesity Management is ongoing with a focus on maintaining weight. She adheres to a category two eating plan 90% of the time, increases vegetable and protein intake, and avoids skipping meals. She maintains weight over the last three weeks, including during Thanksgiving. She is not currently exercising but aims to improve diet, exercise, and weight loss. She is on Mounjaro  5 mg weekly, with increased appetite noted, possibly due to inadequate protein intake or insufficient medication dosage. She has completed six doses of Mounjaro  5 mg, with two doses remaining. - Increased Mounjaro  dose from 5 mg to 7.5 mg weekly. - Encouraged increased protein intake, particularly in the morning, aiming for 30 grams. - Advised to finish current Mounjaro  5 mg supply or switch to 7.5 mg as soon as available. - Continue to follow her Cat 2 eating plan - Goal to increase exercise next month  Type 2 diabetes mellitus with hyperglycemia Type 2 diabetes is managed with Mounjaro  5 mg weekly. Her last A1c improved from 6.6 to 6.1, indicating good control. She is advised to monitor for potential slight increase in A1c due to holiday indulgences, but it is expected to remain well controlled. - Continue Mounjaro  5 mg weekly, with plan to increase to 7.5 mg as per obesity management plan. - Ordered labs to check A1c, kidney function, liver function, electrolytes, and vitamin D  levels.  Hyperlipidemia Managed with lifestyle modifications and Lipitor 40 mg. She is working on improving diet and exercise to aid in weight loss and lipid management. Her cholesterol levels are well controlled. - Continue Lipitor 40 mg daily. - Encouraged continued dietary improvements and exercise.  Depression Managed with Lexapro  30 mg daily, a combination of 20 mg and 10 mg doses. She  requests a refill. - Prescribed Lexapro  20 mg and 10 mg for a 90-day supply.      Patients who are on anti-obesity medications are counseled on the importance of maintaining healthy lifestyle habits, including balanced nutrition, regular physical activity, and behavioral modifications,  Medication is an adjunct to, not a replacement for, lifestyle changes and that the long-term success and weight maintenance depend on continued adherence to these strategies.   Betty Snyder was informed of the importance of frequent follow up visits to maximize her success with intensive lifestyle modifications for her obesity and obesity related health conditions as recommended by USPSTF and CMS guidelines   Louann Penton, MD

## 2024-03-30 ENCOUNTER — Other Ambulatory Visit (HOSPITAL_COMMUNITY): Payer: Self-pay

## 2024-04-01 ENCOUNTER — Other Ambulatory Visit (HOSPITAL_COMMUNITY): Payer: Self-pay

## 2024-04-01 ENCOUNTER — Other Ambulatory Visit: Payer: Self-pay | Admitting: Family Medicine
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
## 2024-04-16 ENCOUNTER — Encounter: Payer: Self-pay | Admitting: Adult Health

## 2024-04-16 ENCOUNTER — Encounter: Admitting: Adult Health

## 2024-04-16 VITALS — BP 128/77 | HR 66 | Ht 61.0 in | Wt 210.4 lb

## 2024-04-16 DIAGNOSIS — G4733 Obstructive sleep apnea (adult) (pediatric): Secondary | ICD-10-CM

## 2024-04-23 ENCOUNTER — Other Ambulatory Visit (INDEPENDENT_AMBULATORY_CARE_PROVIDER_SITE_OTHER): Payer: Self-pay | Admitting: Family Medicine

## 2024-04-23 ENCOUNTER — Other Ambulatory Visit: Payer: Self-pay

## 2024-04-23 ENCOUNTER — Other Ambulatory Visit (HOSPITAL_COMMUNITY): Payer: Self-pay

## 2024-04-23 DIAGNOSIS — E1165 Type 2 diabetes mellitus with hyperglycemia: Secondary | ICD-10-CM

## 2024-04-24 ENCOUNTER — Encounter (INDEPENDENT_AMBULATORY_CARE_PROVIDER_SITE_OTHER): Payer: Self-pay

## 2024-04-24 ENCOUNTER — Ambulatory Visit (INDEPENDENT_AMBULATORY_CARE_PROVIDER_SITE_OTHER): Admitting: Family Medicine

## 2024-04-24 ENCOUNTER — Encounter (HOSPITAL_COMMUNITY): Payer: Self-pay

## 2024-04-24 ENCOUNTER — Other Ambulatory Visit (HOSPITAL_COMMUNITY): Payer: Self-pay

## 2024-04-26 ENCOUNTER — Other Ambulatory Visit (INDEPENDENT_AMBULATORY_CARE_PROVIDER_SITE_OTHER): Payer: Self-pay | Admitting: Family Medicine

## 2024-04-26 DIAGNOSIS — E1165 Type 2 diabetes mellitus with hyperglycemia: Secondary | ICD-10-CM

## 2024-04-29 ENCOUNTER — Other Ambulatory Visit (HOSPITAL_COMMUNITY): Payer: Self-pay

## 2024-04-29 ENCOUNTER — Encounter (HOSPITAL_COMMUNITY): Payer: Self-pay

## 2024-05-02 ENCOUNTER — Encounter (INDEPENDENT_AMBULATORY_CARE_PROVIDER_SITE_OTHER): Payer: Self-pay | Admitting: Family Medicine

## 2024-05-02 ENCOUNTER — Other Ambulatory Visit (HOSPITAL_COMMUNITY): Payer: Self-pay

## 2024-05-02 ENCOUNTER — Ambulatory Visit (INDEPENDENT_AMBULATORY_CARE_PROVIDER_SITE_OTHER): Admitting: Family Medicine

## 2024-05-02 VITALS — BP 127/77 | HR 67 | Temp 97.5°F | Ht 61.0 in | Wt 205.0 lb

## 2024-05-02 DIAGNOSIS — Z6838 Body mass index (BMI) 38.0-38.9, adult: Secondary | ICD-10-CM | POA: Diagnosis not present

## 2024-05-02 DIAGNOSIS — E669 Obesity, unspecified: Secondary | ICD-10-CM

## 2024-05-02 DIAGNOSIS — E1165 Type 2 diabetes mellitus with hyperglycemia: Secondary | ICD-10-CM | POA: Diagnosis not present

## 2024-05-02 DIAGNOSIS — Z7985 Long-term (current) use of injectable non-insulin antidiabetic drugs: Secondary | ICD-10-CM

## 2024-05-02 MED ORDER — TIRZEPATIDE 7.5 MG/0.5ML ~~LOC~~ SOAJ
7.5000 mg | SUBCUTANEOUS | 0 refills | Status: AC
  Filled 2024-05-02: qty 2, 28d supply, fill #0

## 2024-05-02 NOTE — Progress Notes (Signed)
 "  Office: [phone]  /  Fax: [phone]  WEIGHT SUMMARY AND BIOMETRICS  Anthropometric Measurements Height: 5' 1 (1.549 m) Weight: 205 lb (93 kg) BMI (Calculated): 38.75 Weight at Last Visit: 209 lb Weight Lost Since Last Visit: 4 lb Weight Gained Since Last Visit: 0 Starting Weight: 230 lb Total Weight Loss (lbs): 25 lb (11.3 kg) Peak Weight: 230 lb Waist Measurement : 50 inches   Body Composition  Body Fat %: 45.5 % Fat Mass (lbs): 93.4 lbs Muscle Mass (lbs): 106.4 lbs Total Body Water (lbs): 81 lbs Visceral Fat Rating : 14   Other Clinical Data RMR: [id number] Fasting: no Labs: no Today's Visit #: 12 Starting Date: 06/29/23    Chief Complaint: OBESITY    History of Present Illness Betty Snyder is a 59 year old female with obesity and type 2 diabetes who presents for obesity treatment and progress assessment.  She is adhering to a category two eating plan approximately ninety percent of the time, focusing on portion control, making smart choices, and increasing protein intake. She has successfully lost four pounds over the holidays by practicing portion control, such as having a small piece of chocolate pie and limiting bread intake. She does not feel deprived by her dietary changes.  Her Mounjaro  dose was increased from 5 mg to 7.5 mg since her last visit. She notes that the medication does not inhibit her appetite, which she considers important. She requests a refill for the medication.  She is conscious of her protein intake, sometimes substituting meals with tomato soup and compensating by increasing protein in other meals, such as sandwiches or yogurt. She enjoys yogurt with salted caramel flavor and is mindful of her protein intake throughout the day.  She has not been monitoring her blood sugar levels and forgot to schedule a follow-up for this. The last blood sugar check was in August.  She plans to celebrate her birthday in [place], [place] , and  intends to be somewhat conscious of her eating but not overly strict. She typically takes half of her restaurant meals home to manage portion sizes.      PHYSICAL EXAM:  Blood pressure 127/77, pulse 67, temperature (!) 97.5 F (36.4 C), height 5' 1 (1.549 m), weight 205 lb (93 kg), last menstrual period 10/22/2013, SpO2 94%. Body mass index is 38.73 kg/m.  DIAGNOSTIC DATA REVIEWED BY MYSELF TODAY:  BMET    Component Value Date/Time   NA 141 11/29/2023 0923   K 5.0 11/29/2023 0923   CL 102 11/29/2023 0923   CO2 24 11/29/2023 0923   GLUCOSE 81 11/29/2023 0923   GLUCOSE 131 (H) 05/22/2023 0909   BUN 16 11/29/2023 0923   CREATININE 0.80 11/29/2023 0923   CALCIUM  9.5 11/29/2023 0923   GFRNONAA 92 02/18/2019 1112   GFRAA 106 02/18/2019 1112   Lab Results  Component Value Date   HGBA1C 6.1 (H) 11/29/2023   HGBA1C 5.9 02/18/2016   Lab Results  Component Value Date   INSULIN  23.7 11/29/2023   INSULIN  8.3 05/08/2018   Lab Results  Component Value Date   TSH 2.590 06/29/2023   CBC    Component Value Date/Time   WBC 9.1 06/29/2023 0844   WBC 7.6 12/22/2021 0747   RBC 5.21 06/29/2023 0844   RBC 4.80 12/22/2021 0747   HGB 15.3 06/29/2023 0844   HCT 46.7 (H) 06/29/2023 0844   PLT 269 06/29/2023 0844   MCV 90 06/29/2023 0844   MCH 29.4 06/29/2023 0844  MCH 29.1 11/30/2018 0835   MCHC 32.8 06/29/2023 0844   MCHC 33.3 12/22/2021 0747   RDW 13.3 06/29/2023 0844   Iron Studies No results found for: IRON, TIBC, FERRITIN, IRONPCTSAT Lipid Panel     Component Value Date/Time   CHOL 108 11/29/2023 0923   TRIG 111 11/29/2023 0923   HDL 34 (L) 11/29/2023 0923   CHOLHDL 5 05/22/2023 0909   VLDL 77.6 (H) 05/22/2023 0909   LDLCALC 53 11/29/2023 0923   LDLCALC 82 03/03/2021 1630   LDLDIRECT 118.0 05/22/2023 0909   Hepatic Function Panel     Component Value Date/Time   PROT 7.1 11/29/2023 0923   ALBUMIN 4.3 11/29/2023 0923   AST 24 11/29/2023 0923   ALT 32  11/29/2023 0923   ALKPHOS 92 11/29/2023 0923   BILITOT 0.5 11/29/2023 0923      Component Value Date/Time   TSH 2.590 06/29/2023 0844   Nutritional Lab Results  Component Value Date   VD25OH 47.1 11/29/2023   VD25OH 37.7 06/29/2023   VD25OH 25.64 (L) 05/22/2023     Assessment and Plan Assessment & Plan Obesity Management is ongoing with a focus on portion control and increased protein intake. She adheres to the eating plan 90% of the time and has lost four pounds since the last visit. She is not feeling deprived and is able to maintain portion control without skipping meals. She is also adjusting her protein intake by substituting meals when necessary. - Continue current eating plan with focus on portion control and protein intake. - Prescribed Mounjaro  7.5 mg for 90 days with two refills.  Type 2 diabetes mellitus with hyperglycemia Type 2 diabetes is being managed with Mounjaro , which was increased from 5 mg to 7.5 mg since the last visit. She reports no issues with appetite suppression and is able to maintain regular eating habits. Blood sugar monitoring has not been consistent, and she has not yet scheduled a follow-up for blood sugar testing. - Continue Mounjaro  7.5 mg. - Schedule blood sugar testing. - Scheduled follow-up appointment for March 3rd.      Patients who are on anti-obesity medications are counseled on the importance of maintaining healthy lifestyle habits, including balanced nutrition, regular physical activity, and behavioral modifications,  Medication is an adjunct to, not a replacement for, lifestyle changes and that the long-term success and weight maintenance depend on continued adherence to these strategies.   Harper was informed of the importance of frequent follow up visits to maximize her success with intensive lifestyle modifications for her obesity and obesity related health conditions as recommended by USPSTF and CMS guidelines  Louann Penton,  MD   "

## 2024-05-14 ENCOUNTER — Ambulatory Visit (INDEPENDENT_AMBULATORY_CARE_PROVIDER_SITE_OTHER): Admitting: Family Medicine

## 2024-05-22 ENCOUNTER — Ambulatory Visit (INDEPENDENT_AMBULATORY_CARE_PROVIDER_SITE_OTHER): Admitting: Family Medicine

## 2024-06-11 ENCOUNTER — Ambulatory Visit (INDEPENDENT_AMBULATORY_CARE_PROVIDER_SITE_OTHER): Admitting: Family Medicine

## 2024-07-23 ENCOUNTER — Ambulatory Visit (INDEPENDENT_AMBULATORY_CARE_PROVIDER_SITE_OTHER): Admitting: Family Medicine

## 2024-09-03 ENCOUNTER — Ambulatory Visit (INDEPENDENT_AMBULATORY_CARE_PROVIDER_SITE_OTHER): Admitting: Family Medicine

## 2025-04-24 ENCOUNTER — Telehealth: Admitting: Adult Health
